# Patient Record
Sex: Male | Born: 1950 | Race: Black or African American | Hispanic: No | Marital: Married | Smoking: Former smoker
Health system: Southern US, Community
[De-identification: ages and names within clinical notes are randomized; demographics above are authoritative.]

## PROBLEM LIST (undated history)

## (undated) DIAGNOSIS — D509 Iron deficiency anemia, unspecified: Secondary | ICD-10-CM

## (undated) DIAGNOSIS — B182 Chronic viral hepatitis C: Secondary | ICD-10-CM

## (undated) DIAGNOSIS — E1142 Type 2 diabetes mellitus with diabetic polyneuropathy: Secondary | ICD-10-CM

## (undated) DIAGNOSIS — I651 Occlusion and stenosis of basilar artery: Secondary | ICD-10-CM

## (undated) DIAGNOSIS — K513 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: Secondary | ICD-10-CM

## (undated) DIAGNOSIS — R42 Dizziness and giddiness: Secondary | ICD-10-CM

## (undated) DIAGNOSIS — K7400 Hepatic fibrosis, unspecified: Secondary | ICD-10-CM

## (undated) DIAGNOSIS — Z8619 Personal history of other infectious and parasitic diseases: Secondary | ICD-10-CM

## (undated) DIAGNOSIS — E11319 Type 2 diabetes mellitus with unspecified diabetic retinopathy without macular edema: Secondary | ICD-10-CM

## (undated) DIAGNOSIS — M1990 Unspecified osteoarthritis, unspecified site: Secondary | ICD-10-CM

## (undated) DIAGNOSIS — E119 Type 2 diabetes mellitus without complications: Secondary | ICD-10-CM

## (undated) DIAGNOSIS — R202 Paresthesia of skin: Secondary | ICD-10-CM

## (undated) DIAGNOSIS — F32A Depression, unspecified: Secondary | ICD-10-CM

## (undated) DIAGNOSIS — R269 Unspecified abnormalities of gait and mobility: Secondary | ICD-10-CM

## (undated) DIAGNOSIS — I6502 Occlusion and stenosis of left vertebral artery: Secondary | ICD-10-CM

## (undated) HISTORY — PX: OTHER SURGICAL HISTORY: SHX169

---

## 2000-06-02 ENCOUNTER — Encounter: Admission: RE | Admit: 2000-06-02 | Discharge: 2000-06-02 | Payer: Self-pay | Admitting: Emergency Medicine

## 2000-06-02 ENCOUNTER — Encounter: Payer: Self-pay | Admitting: Emergency Medicine

## 2000-06-22 ENCOUNTER — Encounter: Payer: Self-pay | Admitting: General Surgery

## 2000-06-23 ENCOUNTER — Observation Stay (HOSPITAL_COMMUNITY): Admission: RE | Admit: 2000-06-23 | Discharge: 2000-06-24 | Payer: Self-pay | Admitting: General Surgery

## 2000-06-23 ENCOUNTER — Encounter (INDEPENDENT_AMBULATORY_CARE_PROVIDER_SITE_OTHER): Payer: Self-pay

## 2005-03-04 ENCOUNTER — Encounter: Admission: RE | Admit: 2005-03-04 | Discharge: 2005-03-04 | Payer: Self-pay | Admitting: Emergency Medicine

## 2007-05-01 ENCOUNTER — Ambulatory Visit (HOSPITAL_BASED_OUTPATIENT_CLINIC_OR_DEPARTMENT_OTHER): Admission: RE | Admit: 2007-05-01 | Discharge: 2007-05-01 | Payer: Self-pay | Admitting: Orthopedic Surgery

## 2007-12-28 ENCOUNTER — Encounter: Admission: RE | Admit: 2007-12-28 | Discharge: 2007-12-28 | Payer: Self-pay | Admitting: Emergency Medicine

## 2008-09-26 ENCOUNTER — Encounter: Admission: RE | Admit: 2008-09-26 | Discharge: 2008-09-26 | Payer: Self-pay | Admitting: Family Medicine

## 2010-01-20 ENCOUNTER — Encounter: Admission: RE | Admit: 2010-01-20 | Discharge: 2010-01-20 | Payer: Self-pay | Admitting: Family Medicine

## 2010-06-07 ENCOUNTER — Encounter: Payer: Self-pay | Admitting: Emergency Medicine

## 2010-09-24 ENCOUNTER — Ambulatory Visit
Admission: RE | Admit: 2010-09-24 | Discharge: 2010-09-24 | Disposition: A | Discharge status: Home / Self Care | Payer: 59 | Source: Ambulatory Visit | Attending: Family Medicine | Admitting: Family Medicine

## 2010-09-24 ENCOUNTER — Other Ambulatory Visit: Payer: Self-pay | Admitting: Family Medicine

## 2010-09-24 DIAGNOSIS — R0602 Shortness of breath: Secondary | ICD-10-CM

## 2010-09-28 NOTE — Op Note (Signed)
NAMEBRADIN, Kirk Rocha NO.:  [id number]   MEDICAL RECORD NO.:  [id number]          PATIENT TYPE:  AMB   LOCATION:  DSC                          FACILITY:  [facility]   PHYSICIAN:  Cindee Salt, M.D.       DATE OF BIRTH:  04-01-1951   DATE OF PROCEDURE:  05/01/2007  DATE OF DISCHARGE:                               OPERATIVE REPORT   PREOPERATIVE DIAGNOSIS:  Stenosing tenosynovitis right ring finger.   POSTOPERATIVE DIAGNOSIS:  Stenosing tenosynovitis right ring finger.   OPERATION:  Release A1 pulley right ring finger.   SURGEON:  Cindee Salt, M.D.   ANESTHESIA:  Forearm based IV regional.   HISTORY:  The patient is a 61 year old male with a history of triggering  of the right ring finger.  This has not responded to conservative  treatment.  He is desirous of surgical release.  Preoperative and  postoperative course were discussed along with the risks and  complications.  He is aware there is no guarantee with the surgery, the  possibility of infection, recurrence, injury to arteries, nerves,  tendons, incomplete relief of symptoms, dystrophy.  In the preoperative  area, the patient is seen, questions encouraged and answered, the  extremity marked by both the patient and surgeon.   PROCEDURE:  The patient is brought to the operating room where a forearm  based IV regional anesthetic was carried out without difficulty.  He was  prepped using DuraPrep, supine position, right arm free.  An oblique  incision was made over the A1 pulley right ring finger and carried down  through subcutaneous tissue.  Bleeders were electrocauterized.  The  neurovascular structures were retracted. The A1 pulley was identified.  This was found to be thickened.  An incision was then made on its radial  aspect releasing the entire A1 pulley.  A small incision was made  centrally in the A2 pulley.  The finger was placed through a full range  motion, no further triggering was identified.  The  wound was irrigated.  The skin was then closed with interrupted follow 5-0 Vicryl Rapide  sutures.  A sterile compressive dressing was applied.  The patient  tolerated the procedure well and was taken to the recovery room for  observation in satisfactory condition.  He will be discharged home to  return to the [facility] [place] in one week on [place] [facility].           ______________________________  Cindee Salt, M.D.     GK/MEDQ  D:  05/01/2007  T:  05/01/2007  Job:  [id number]

## 2010-10-01 NOTE — Op Note (Signed)
[facility]  Patient:    Kirk Rocha, Kirk Rocha                      MRN: [id number] Proc. Date: 06/23/00 Adm. Date:  [id number] Disc. Date: [id number] Attending:  Chevis Pretty S                           Operative Report  PREOPERATIVE DIAGNOSIS:  Symptomatic cholelithiasis.  POSTOPERATIVE DIAGNOSIS: Symptomatic cholelithiasis.  OPERATION:  Laparoscopic cholecystectomy.  SURGEON:  Chevis Pretty, M.D.  ASSISTANT:  Donnie Coffin. Samuella Cota, M.D.  ANESTHESIA:  General endotracheal anesthesia.  DESCRIPTION OF PROCEDURE:  After informed consent was obtained, the patient was brought to the operating room and placed in the supine position on the operating table.  After adequate induction of general anesthesia, the patients abdomen was prepped with Betadine and draped in the usual sterile manner.  A small transverse supraumbilical incision was made with a #15 blade knife.  This incision was carried down through the subcutaneous tissue bluntly using a Kelly clamp and [employer] retractors until the linea alba was identified.  The linea alba was also incised with the #15 blade knife, and each side was grasped with Kocher clamps and elevated.  The preperitoneal space was probed bluntly with the hemostat until the peritoneum was opened and the access was gained to the abdominal cavity.  A finger was inserted through this hole, and the anterior abdominal wall was palpated and no adhesions apparent.  A 0 Vicryl pursestring stitch was placed in the fascia around this hole.  A Hasson cannula was placed through this hole into the abdominal cavity and anchored with previously placed Vicryl pursestring stitch.  The abdomen was then insufflated with carbon dioxide, and the laparoscope was placed through the Hasson cannula.  The liver edge and dome of the gallbladder were readily identifiable.  A small transverse upper midline incision was  made with a #15 blade knife after this area was  infiltrated with 0.25% Marcaine, and a 10 mm port was placed through this incision bluntly into the abdominal cavity under direct vision.  Two smaller incisions were made laterally on the right side of the abdomen below the costal margin after infiltrating this area with 0.25% Marcaine, and two 5 mm ports were placed through these incisions bluntly into the abdominal cavity again under direct vision.  A blunt grasper was placed through the lateral most 5 mm port and used to grasp the dome of the gallbladder and elevate it anteriorly and superiorly.  A blunt grasper was placed through the other 5 mm port and used to retract on the body and neck of the gallbladder.  A [place] dissector was placed through the upper midline port and, using the electrocautery, the peritoneal reflection over top of the gallbladder neck area was opened.  Once this was accomplished, the rest of the area around the gallbladder neck and cystic duct was dissected in a blunt manner with the [place] dissector until the gallbladder neck/cystic duct junction was readily identified and freed in a circumferential manner.  Care was taken at this point to make sure the common duct was medial to all this dissection.  Three clips were placed proximally and one distally on the cystic duct, the the cystic duct was divided between the two with the laparoscopic scissors.  Several small vessels were identified posterior to this, and each was dissected circumferentially with the [employer].  Two  clamps were placed proximally and one distally on each of these vessels, and each of these was divided between the two with the laparoscopic scissors.  The gallbladder was then separated from the liver bed using the hook electrocautery.  Prior to completely detaching the gallbladder from the liver bed, the liver bed was inspected, and several small bleeding points were coagulated with the Bovie electrocautery.  The rest of the  gallbladder was then removed form the liver bed using the hook electrocautery.  The laparoscope was moved to the upper midline incision, and a gallbladder grasper was placed through the Hasson cannula and grasped the neck of the gallbladder.  The gallbladder was then removed through the supraumbilical port with the Hasson cannula without difficulty.  The fascia at this incision was then closed with the previously placed Vicryl pursestring stitch.  The abdomen was then irrigated with copious amounts of saline until the affluent was clear.  Each of the rest of the ports were then removed under direct vision and found to be hemostatic.  The skin incisions were then closed with interrupted 4-0 Monocryl subcuticular stitches.  Benzoin and Steri-Strips were applied.  The patient tolerated the procedure well.  At the end of the case, all needle, sponge, and instrument counts were correct.  The patient was awakened and taken to the recovery room in stable condition. DD:  06/23/00 TD:  06/25/00 Job: [id number]

## 2011-02-18 LAB — POCT HEMOGLOBIN-HEMACUE: Hemoglobin: 15.6

## 2011-05-16 ENCOUNTER — Ambulatory Visit
Admission: RE | Admit: 2011-05-16 | Discharge: 2011-05-16 | Disposition: A | Discharge status: Home / Self Care | Payer: 59 | Source: Ambulatory Visit | Attending: Family Medicine | Admitting: Family Medicine

## 2011-05-16 ENCOUNTER — Other Ambulatory Visit: Payer: Self-pay | Admitting: Family Medicine

## 2011-05-16 DIAGNOSIS — K921 Melena: Secondary | ICD-10-CM

## 2012-02-07 ENCOUNTER — Other Ambulatory Visit: Payer: Self-pay | Admitting: Family Medicine

## 2012-02-07 ENCOUNTER — Ambulatory Visit
Admission: RE | Admit: 2012-02-07 | Discharge: 2012-02-07 | Disposition: A | Discharge status: Home / Self Care | Payer: 59 | Source: Ambulatory Visit | Attending: Family Medicine | Admitting: Family Medicine

## 2012-02-07 DIAGNOSIS — M25519 Pain in unspecified shoulder: Secondary | ICD-10-CM

## 2013-02-11 ENCOUNTER — Encounter (INDEPENDENT_AMBULATORY_CARE_PROVIDER_SITE_OTHER): Payer: 59 | Admitting: Ophthalmology

## 2013-02-11 DIAGNOSIS — E1139 Type 2 diabetes mellitus with other diabetic ophthalmic complication: Secondary | ICD-10-CM

## 2013-02-11 DIAGNOSIS — H43819 Vitreous degeneration, unspecified eye: Secondary | ICD-10-CM

## 2013-02-11 DIAGNOSIS — E11319 Type 2 diabetes mellitus with unspecified diabetic retinopathy without macular edema: Secondary | ICD-10-CM

## 2013-02-11 DIAGNOSIS — H2510 Age-related nuclear cataract, unspecified eye: Secondary | ICD-10-CM

## 2014-02-17 ENCOUNTER — Ambulatory Visit (INDEPENDENT_AMBULATORY_CARE_PROVIDER_SITE_OTHER): Payer: 59 | Admitting: Ophthalmology

## 2014-03-24 ENCOUNTER — Ambulatory Visit (INDEPENDENT_AMBULATORY_CARE_PROVIDER_SITE_OTHER): Payer: 59 | Admitting: Ophthalmology

## 2014-03-24 DIAGNOSIS — E11319 Type 2 diabetes mellitus with unspecified diabetic retinopathy without macular edema: Secondary | ICD-10-CM

## 2014-03-24 DIAGNOSIS — E11329 Type 2 diabetes mellitus with mild nonproliferative diabetic retinopathy without macular edema: Secondary | ICD-10-CM

## 2014-03-24 DIAGNOSIS — H43813 Vitreous degeneration, bilateral: Secondary | ICD-10-CM

## 2015-03-25 ENCOUNTER — Ambulatory Visit (INDEPENDENT_AMBULATORY_CARE_PROVIDER_SITE_OTHER): Payer: 59 | Admitting: Ophthalmology

## 2015-04-22 ENCOUNTER — Ambulatory Visit (INDEPENDENT_AMBULATORY_CARE_PROVIDER_SITE_OTHER): Payer: Commercial Managed Care - HMO | Admitting: Ophthalmology

## 2015-04-22 DIAGNOSIS — E11319 Type 2 diabetes mellitus with unspecified diabetic retinopathy without macular edema: Secondary | ICD-10-CM

## 2015-04-22 DIAGNOSIS — H2513 Age-related nuclear cataract, bilateral: Secondary | ICD-10-CM | POA: Diagnosis not present

## 2015-04-22 DIAGNOSIS — E113293 Type 2 diabetes mellitus with mild nonproliferative diabetic retinopathy without macular edema, bilateral: Secondary | ICD-10-CM | POA: Diagnosis not present

## 2015-04-22 DIAGNOSIS — H43813 Vitreous degeneration, bilateral: Secondary | ICD-10-CM

## 2015-07-23 DIAGNOSIS — Z79899 Other long term (current) drug therapy: Secondary | ICD-10-CM | POA: Diagnosis not present

## 2015-07-23 DIAGNOSIS — Z125 Encounter for screening for malignant neoplasm of prostate: Secondary | ICD-10-CM | POA: Diagnosis not present

## 2015-07-23 DIAGNOSIS — E785 Hyperlipidemia, unspecified: Secondary | ICD-10-CM | POA: Diagnosis not present

## 2015-07-23 DIAGNOSIS — Z23 Encounter for immunization: Secondary | ICD-10-CM | POA: Diagnosis not present

## 2015-07-23 DIAGNOSIS — E11311 Type 2 diabetes mellitus with unspecified diabetic retinopathy with macular edema: Secondary | ICD-10-CM | POA: Diagnosis not present

## 2015-07-23 DIAGNOSIS — Z0000 Encounter for general adult medical examination without abnormal findings: Secondary | ICD-10-CM | POA: Diagnosis not present

## 2015-07-28 ENCOUNTER — Other Ambulatory Visit: Payer: Self-pay | Admitting: Family Medicine

## 2015-07-28 DIAGNOSIS — Z136 Encounter for screening for cardiovascular disorders: Secondary | ICD-10-CM

## 2015-08-05 ENCOUNTER — Ambulatory Visit
Admission: RE | Admit: 2015-08-05 | Discharge: 2015-08-05 | Disposition: A | Discharge status: Home / Self Care | Payer: Medicare Other | Source: Ambulatory Visit | Attending: Family Medicine | Admitting: Family Medicine

## 2015-08-05 DIAGNOSIS — Z136 Encounter for screening for cardiovascular disorders: Secondary | ICD-10-CM

## 2016-01-29 DIAGNOSIS — Z23 Encounter for immunization: Secondary | ICD-10-CM | POA: Diagnosis not present

## 2016-01-29 DIAGNOSIS — E785 Hyperlipidemia, unspecified: Secondary | ICD-10-CM | POA: Diagnosis not present

## 2016-01-29 DIAGNOSIS — Z7984 Long term (current) use of oral hypoglycemic drugs: Secondary | ICD-10-CM | POA: Diagnosis not present

## 2016-01-29 DIAGNOSIS — E781 Pure hyperglyceridemia: Secondary | ICD-10-CM | POA: Diagnosis not present

## 2016-01-29 DIAGNOSIS — E1165 Type 2 diabetes mellitus with hyperglycemia: Secondary | ICD-10-CM | POA: Diagnosis not present

## 2016-04-21 ENCOUNTER — Ambulatory Visit (INDEPENDENT_AMBULATORY_CARE_PROVIDER_SITE_OTHER): Payer: Medicare Other | Admitting: Ophthalmology

## 2016-04-21 DIAGNOSIS — E11319 Type 2 diabetes mellitus with unspecified diabetic retinopathy without macular edema: Secondary | ICD-10-CM

## 2016-04-21 DIAGNOSIS — E113293 Type 2 diabetes mellitus with mild nonproliferative diabetic retinopathy without macular edema, bilateral: Secondary | ICD-10-CM

## 2016-04-21 DIAGNOSIS — H43813 Vitreous degeneration, bilateral: Secondary | ICD-10-CM | POA: Diagnosis not present

## 2016-06-16 DIAGNOSIS — Z7984 Long term (current) use of oral hypoglycemic drugs: Secondary | ICD-10-CM | POA: Diagnosis not present

## 2016-06-16 DIAGNOSIS — E781 Pure hyperglyceridemia: Secondary | ICD-10-CM | POA: Diagnosis not present

## 2016-06-16 DIAGNOSIS — E1165 Type 2 diabetes mellitus with hyperglycemia: Secondary | ICD-10-CM | POA: Diagnosis not present

## 2016-07-29 DIAGNOSIS — Z7984 Long term (current) use of oral hypoglycemic drugs: Secondary | ICD-10-CM | POA: Diagnosis not present

## 2016-07-29 DIAGNOSIS — E11311 Type 2 diabetes mellitus with unspecified diabetic retinopathy with macular edema: Secondary | ICD-10-CM | POA: Diagnosis not present

## 2016-07-29 DIAGNOSIS — I708 Atherosclerosis of other arteries: Secondary | ICD-10-CM | POA: Diagnosis not present

## 2016-07-29 DIAGNOSIS — Z125 Encounter for screening for malignant neoplasm of prostate: Secondary | ICD-10-CM | POA: Diagnosis not present

## 2016-07-29 DIAGNOSIS — H3581 Retinal edema: Secondary | ICD-10-CM | POA: Diagnosis not present

## 2016-07-29 DIAGNOSIS — K649 Unspecified hemorrhoids: Secondary | ICD-10-CM | POA: Diagnosis not present

## 2016-07-29 DIAGNOSIS — Z0000 Encounter for general adult medical examination without abnormal findings: Secondary | ICD-10-CM | POA: Diagnosis not present

## 2016-07-29 DIAGNOSIS — E1140 Type 2 diabetes mellitus with diabetic neuropathy, unspecified: Secondary | ICD-10-CM | POA: Diagnosis not present

## 2016-07-29 DIAGNOSIS — Z79899 Other long term (current) drug therapy: Secondary | ICD-10-CM | POA: Diagnosis not present

## 2016-07-29 DIAGNOSIS — E113593 Type 2 diabetes mellitus with proliferative diabetic retinopathy without macular edema, bilateral: Secondary | ICD-10-CM | POA: Diagnosis not present

## 2016-07-29 DIAGNOSIS — E785 Hyperlipidemia, unspecified: Secondary | ICD-10-CM | POA: Diagnosis not present

## 2016-08-08 DIAGNOSIS — K625 Hemorrhage of anus and rectum: Secondary | ICD-10-CM | POA: Diagnosis not present

## 2016-08-22 DIAGNOSIS — K625 Hemorrhage of anus and rectum: Secondary | ICD-10-CM | POA: Diagnosis not present

## 2016-08-22 DIAGNOSIS — R141 Gas pain: Secondary | ICD-10-CM | POA: Diagnosis not present

## 2016-08-22 DIAGNOSIS — K648 Other hemorrhoids: Secondary | ICD-10-CM | POA: Diagnosis not present

## 2016-09-21 DIAGNOSIS — K5289 Other specified noninfective gastroenteritis and colitis: Secondary | ICD-10-CM | POA: Diagnosis not present

## 2016-09-21 DIAGNOSIS — K640 First degree hemorrhoids: Secondary | ICD-10-CM | POA: Diagnosis not present

## 2016-09-21 DIAGNOSIS — K625 Hemorrhage of anus and rectum: Secondary | ICD-10-CM | POA: Diagnosis not present

## 2016-09-27 DIAGNOSIS — K5289 Other specified noninfective gastroenteritis and colitis: Secondary | ICD-10-CM | POA: Diagnosis not present

## 2016-10-04 DIAGNOSIS — K513 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: Secondary | ICD-10-CM | POA: Diagnosis not present

## 2016-10-26 DIAGNOSIS — K513 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: Secondary | ICD-10-CM | POA: Diagnosis not present

## 2016-10-31 DIAGNOSIS — E1165 Type 2 diabetes mellitus with hyperglycemia: Secondary | ICD-10-CM | POA: Diagnosis not present

## 2017-01-02 ENCOUNTER — Encounter: Payer: Self-pay | Admitting: Family Medicine

## 2017-02-14 DIAGNOSIS — E785 Hyperlipidemia, unspecified: Secondary | ICD-10-CM | POA: Diagnosis not present

## 2017-02-14 DIAGNOSIS — Z23 Encounter for immunization: Secondary | ICD-10-CM | POA: Diagnosis not present

## 2017-02-14 DIAGNOSIS — E08311 Diabetes mellitus due to underlying condition with unspecified diabetic retinopathy with macular edema: Secondary | ICD-10-CM | POA: Diagnosis not present

## 2017-03-24 DIAGNOSIS — E1165 Type 2 diabetes mellitus with hyperglycemia: Secondary | ICD-10-CM | POA: Diagnosis not present

## 2017-03-24 DIAGNOSIS — Z7984 Long term (current) use of oral hypoglycemic drugs: Secondary | ICD-10-CM | POA: Diagnosis not present

## 2017-03-31 DIAGNOSIS — K51319 Ulcerative (chronic) rectosigmoiditis with unspecified complications: Secondary | ICD-10-CM | POA: Diagnosis not present

## 2017-04-21 ENCOUNTER — Ambulatory Visit (INDEPENDENT_AMBULATORY_CARE_PROVIDER_SITE_OTHER): Payer: Medicare Other | Admitting: Ophthalmology

## 2017-04-21 DIAGNOSIS — E11319 Type 2 diabetes mellitus with unspecified diabetic retinopathy without macular edema: Secondary | ICD-10-CM

## 2017-04-21 DIAGNOSIS — H43813 Vitreous degeneration, bilateral: Secondary | ICD-10-CM

## 2017-04-21 DIAGNOSIS — E113292 Type 2 diabetes mellitus with mild nonproliferative diabetic retinopathy without macular edema, left eye: Secondary | ICD-10-CM

## 2017-04-21 DIAGNOSIS — H2511 Age-related nuclear cataract, right eye: Secondary | ICD-10-CM | POA: Diagnosis not present

## 2017-05-03 DIAGNOSIS — Z7984 Long term (current) use of oral hypoglycemic drugs: Secondary | ICD-10-CM | POA: Diagnosis not present

## 2017-05-03 DIAGNOSIS — E1165 Type 2 diabetes mellitus with hyperglycemia: Secondary | ICD-10-CM | POA: Diagnosis not present

## 2017-08-02 DIAGNOSIS — E7800 Pure hypercholesterolemia, unspecified: Secondary | ICD-10-CM | POA: Diagnosis not present

## 2017-08-02 DIAGNOSIS — E11319 Type 2 diabetes mellitus with unspecified diabetic retinopathy without macular edema: Secondary | ICD-10-CM | POA: Diagnosis not present

## 2017-08-02 DIAGNOSIS — E1140 Type 2 diabetes mellitus with diabetic neuropathy, unspecified: Secondary | ICD-10-CM | POA: Diagnosis not present

## 2017-08-02 DIAGNOSIS — E1165 Type 2 diabetes mellitus with hyperglycemia: Secondary | ICD-10-CM | POA: Diagnosis not present

## 2017-08-22 DIAGNOSIS — Z23 Encounter for immunization: Secondary | ICD-10-CM | POA: Diagnosis not present

## 2017-08-22 DIAGNOSIS — E1165 Type 2 diabetes mellitus with hyperglycemia: Secondary | ICD-10-CM | POA: Diagnosis not present

## 2017-08-22 DIAGNOSIS — I651 Occlusion and stenosis of basilar artery: Secondary | ICD-10-CM | POA: Diagnosis not present

## 2017-08-22 DIAGNOSIS — F322 Major depressive disorder, single episode, severe without psychotic features: Secondary | ICD-10-CM | POA: Diagnosis not present

## 2017-08-22 DIAGNOSIS — Z125 Encounter for screening for malignant neoplasm of prostate: Secondary | ICD-10-CM | POA: Diagnosis not present

## 2017-08-22 DIAGNOSIS — Z794 Long term (current) use of insulin: Secondary | ICD-10-CM | POA: Diagnosis not present

## 2017-08-22 DIAGNOSIS — E1140 Type 2 diabetes mellitus with diabetic neuropathy, unspecified: Secondary | ICD-10-CM | POA: Diagnosis not present

## 2017-08-22 DIAGNOSIS — Z0000 Encounter for general adult medical examination without abnormal findings: Secondary | ICD-10-CM | POA: Diagnosis not present

## 2017-08-22 DIAGNOSIS — E11319 Type 2 diabetes mellitus with unspecified diabetic retinopathy without macular edema: Secondary | ICD-10-CM | POA: Diagnosis not present

## 2017-08-22 DIAGNOSIS — I6602 Occlusion and stenosis of left middle cerebral artery: Secondary | ICD-10-CM | POA: Diagnosis not present

## 2017-08-22 DIAGNOSIS — Z1159 Encounter for screening for other viral diseases: Secondary | ICD-10-CM | POA: Diagnosis not present

## 2017-08-30 DIAGNOSIS — R768 Other specified abnormal immunological findings in serum: Secondary | ICD-10-CM | POA: Diagnosis not present

## 2017-09-15 DIAGNOSIS — B182 Chronic viral hepatitis C: Secondary | ICD-10-CM | POA: Diagnosis not present

## 2017-09-18 ENCOUNTER — Other Ambulatory Visit: Payer: Self-pay | Admitting: Nurse Practitioner

## 2017-09-18 DIAGNOSIS — B1920 Unspecified viral hepatitis C without hepatic coma: Secondary | ICD-10-CM

## 2017-09-21 ENCOUNTER — Encounter: Payer: Medicare Other | Attending: Family Medicine

## 2017-09-21 DIAGNOSIS — E1165 Type 2 diabetes mellitus with hyperglycemia: Secondary | ICD-10-CM

## 2017-09-21 DIAGNOSIS — Z713 Dietary counseling and surveillance: Secondary | ICD-10-CM | POA: Diagnosis not present

## 2017-09-21 NOTE — Progress Notes (Signed)
Diabetes Self-Management Education  Visit Type: First/Initial  Appt. Start Time: 8:00 Appt. End Time: 9:15  09/21/2017  Mr. Kirk Rocha, identified by name and date of birth, is a 67 y.o. male with a diagnosis of Diabetes: Type 2.   ASSESSMENT  Pt referred for diabetes education. Pt reports recent weight gain of 18 lbs and a heightened appetite r/t prescribed steroid medication. Pt forgot list of medications at visit today and could not remember names or doses (6-7 medications daily). 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 He is willing to check his feet daily and is planning on seeing a dentist.   Height  (1.676 m), weight 168 lb (76.2 kg). Body mass index is 27.12 kg/m.  Diabetes Self-Management Education - 09/21/17 0914      Visit Information   Visit Type  First/Initial      Initial Visit   Diabetes Type  Type 2    Are you currently following a meal plan?  No    Are you taking your medications as prescribed?  Yes    Date Diagnosed  2010      Health Coping   How would you rate your overall health?  Good      Psychosocial Assessment   Patient Belief/Attitude about Diabetes  Afraid    Self-care barriers  None    Self-management support  Family    Patient Concerns  Nutrition/Meal planning;Weight Control    Special Needs  None    Preferred Learning Style  No preference indicated    Learning Readiness  Contemplating    How often do you need to have someone help you when you read instructions, pamphlets, or other written materials from your doctor or pharmacy?  1 - Never    What is the last grade level you completed in school?  12th grade      Pre-Education Assessment   Patient understands the diabetes disease and treatment process.  Needs Instruction    Patient understands incorporating nutritional management into lifestyle.  Needs Instruction    Patient undertands incorporating physical activity into lifestyle.  Needs Instruction    Patient understands using  medications safely.  Needs Review    Patient understands monitoring blood glucose, interpreting and using results  Demonstrates understanding / competency    Patient understands prevention, detection, and treatment of acute complications.  Needs Instruction    Patient understands prevention, detection, and treatment of chronic complications.  Needs Instruction    Patient understands how to develop strategies to address psychosocial issues.  Needs Instruction    Patient understands how to develop strategies to promote health/change behavior.  Needs Instruction      Complications   Last HgB A1C per patient/outside source  9.3 %    How often do you check your blood sugar?  3-4 times/day    Fasting Blood glucose range (mg/dL)  478-295    Postprandial Blood glucose range (mg/dL)  621-308    Number of hypoglycemic episodes per month  1    Can you tell when your blood sugar is low?  Yes    What do you do if your blood sugar is low?  "eat something"    Number of hyperglycemic episodes per week  7    Can you tell when your blood sugar is high?  No    Have you had a dilated eye exam in the past 12 months?  Yes    Have you had a dental exam in the past 12 months?  No    Are you checking your feet?  No      Dietary Intake   Breakfast  2 scrambled eggs and 2 bacon strip sandwich on 2 pieces of honey weat toast with mayo    Lunch  green beans, pork ribs, sweet potato casserole with sugar and butter, clam chowder soup, fried okra, crackers, 14-15 popcorn shrimp     Snack (afternoon)  4 cookies from [employer]  fried chicken (2 wings, 1 leg), cauliflower and corn    Snack (evening)  ~handful of honey roasted peanuts    Beverage(s)  unsweet tea, Peach Nature's Twist, 2-4 bottles of water      Exercise   Exercise Type  ADL's    How many days per week to you exercise?  0      Patient Education   Previous Diabetes Education  Yes (please comment) RD spoke with him ~4-5 years ago    Disease  state   Definition of diabetes, type 1 and 2, and the diagnosis of diabetes;Factors that contribute to the development of diabetes    Nutrition management   Role of diet in the treatment of diabetes and the relationship between the three main macronutrients and blood glucose level;Carbohydrate counting;Information on hints to eating out and maintain blood glucose control.;Food label reading, portion sizes and measuring food.    Physical activity and exercise   Role of exercise on diabetes management, blood pressure control and cardiac health.;Identified with patient nutritional and/or medication changes necessary with exercise.    Monitoring  Daily foot exams;Yearly dilated eye exam    Acute complications  Taught treatment of hypoglycemia - the 15 rule.    Chronic complications  Relationship between chronic complications and blood glucose control;Assessed and discussed foot care and prevention of foot problems;Identified and discussed with patient  current chronic complications;Retinopathy and reason for yearly dilated eye exams;Dental care    Psychosocial adjustment  Identified and addressed patients feelings and concerns about diabetes;Role of stress on diabetes;Brainstormed with patient on coping mechanisms for social situations, getting support from significant others, dealing with feelings about diabetes    Personal strategies to promote health  Lifestyle issues that need to be addressed for better diabetes care      Individualized Goals (developed by patient)   Nutrition  General guidelines for healthy choices and portions discussed    Physical Activity  Exercise 3-5 times per week    Medications  take my medication as prescribed    Monitoring   test my blood glucose as discussed    Reducing Risk  do foot checks daily;examine blood glucose patterns;treat hypoglycemia with 15 grams of carbs if blood glucose less than /dL      Post-Education Assessment   Patient understands the diabetes  disease and treatment process.  Demonstrates understanding / competency    Patient understands incorporating nutritional management into lifestyle.  Demonstrates understanding / competency    Patient undertands incorporating physical activity into lifestyle.  Demonstrates understanding / competency    Patient understands using medications safely.  Needs Review    Patient understands monitoring blood glucose, interpreting and using results  Demonstrates understanding / competency    Patient understands prevention, detection, and treatment of acute complications.  Demonstrates understanding / competency    Patient understands prevention, detection, and treatment of chronic complications.  Demonstrates understanding / competency    Patient understands how to develop strategies to address psychosocial issues.  Needs Review  Patient understands how to develop strategies to promote health/change behavior.  Demonstrates understanding / competency      Outcomes   Expected Outcomes  Demonstrated limited interest in learning.  Expect minimal changes    Future DMSE  PRN    Program Status  Completed       Individualized Plan for Diabetes Self-Management Training:   Learning Objective:  Patient will have a greater understanding of diabetes self-management. Patient education plan is to attend individual and/or group sessions per assessed needs and concerns.   Plan:   Patient Instructions   3-4 carbohydrate servings at each meal   Manage stress levels, work on sleep pattern (increase hours), increase activity level (walk around mall 5 times/week)  Daily foot checks   To aid with bowel movements; water, activity and fiber  15-15 rule for hypoglycemia: 15 grams fast acting carbohydrate, wait 15 minutes, recheck glucose levels    Expected Outcomes:  Demonstrated limited interest in learning.  Expect minimal changes  Education material provided: Living Well with Diabetes, A1C conversion sheet,  Meal plan card and Snack sheet  If problems or questions, patient to contact team via:  Phone  Future DSME appointment: PRN

## 2017-09-21 NOTE — Patient Instructions (Addendum)
   3-4 carbohydrate servings at each meal   Manage stress levels, work on sleep pattern (increase hours), increase activity level (walk around mall 5 times/week)  Daily foot checks   To aid with bowel movements; water, activity and fiber  15-15 rule for hypoglycemia: 15 grams fast acting carbohydrate, wait 15 minutes, recheck glucose levels

## 2017-09-29 DIAGNOSIS — Z23 Encounter for immunization: Secondary | ICD-10-CM | POA: Diagnosis not present

## 2017-10-18 ENCOUNTER — Ambulatory Visit
Admission: RE | Admit: 2017-10-18 | Discharge: 2017-10-18 | Disposition: A | Discharge status: Home / Self Care | Payer: Medicare Other | Source: Ambulatory Visit | Attending: Nurse Practitioner | Admitting: Nurse Practitioner

## 2017-10-18 DIAGNOSIS — B1920 Unspecified viral hepatitis C without hepatic coma: Secondary | ICD-10-CM

## 2017-10-18 DIAGNOSIS — B182 Chronic viral hepatitis C: Secondary | ICD-10-CM | POA: Diagnosis not present

## 2017-10-30 DIAGNOSIS — Z23 Encounter for immunization: Secondary | ICD-10-CM | POA: Diagnosis not present

## 2017-11-06 DIAGNOSIS — E1165 Type 2 diabetes mellitus with hyperglycemia: Secondary | ICD-10-CM | POA: Diagnosis not present

## 2017-11-06 DIAGNOSIS — E7800 Pure hypercholesterolemia, unspecified: Secondary | ICD-10-CM | POA: Diagnosis not present

## 2017-11-06 DIAGNOSIS — E1140 Type 2 diabetes mellitus with diabetic neuropathy, unspecified: Secondary | ICD-10-CM | POA: Diagnosis not present

## 2017-11-06 DIAGNOSIS — E11319 Type 2 diabetes mellitus with unspecified diabetic retinopathy without macular edema: Secondary | ICD-10-CM | POA: Diagnosis not present

## 2017-11-08 DIAGNOSIS — B182 Chronic viral hepatitis C: Secondary | ICD-10-CM | POA: Diagnosis not present

## 2017-11-08 DIAGNOSIS — K740 Hepatic fibrosis: Secondary | ICD-10-CM | POA: Diagnosis not present

## 2017-11-21 DIAGNOSIS — E785 Hyperlipidemia, unspecified: Secondary | ICD-10-CM | POA: Diagnosis not present

## 2017-12-11 DIAGNOSIS — B182 Chronic viral hepatitis C: Secondary | ICD-10-CM | POA: Diagnosis not present

## 2017-12-22 DIAGNOSIS — M7542 Impingement syndrome of left shoulder: Secondary | ICD-10-CM | POA: Diagnosis not present

## 2017-12-22 DIAGNOSIS — M25512 Pain in left shoulder: Secondary | ICD-10-CM | POA: Insufficient documentation

## 2018-02-06 DIAGNOSIS — B182 Chronic viral hepatitis C: Secondary | ICD-10-CM | POA: Diagnosis not present

## 2018-02-21 DIAGNOSIS — K740 Hepatic fibrosis: Secondary | ICD-10-CM | POA: Diagnosis not present

## 2018-02-21 DIAGNOSIS — B182 Chronic viral hepatitis C: Secondary | ICD-10-CM | POA: Diagnosis not present

## 2018-03-05 DIAGNOSIS — E7800 Pure hypercholesterolemia, unspecified: Secondary | ICD-10-CM | POA: Diagnosis not present

## 2018-03-05 DIAGNOSIS — E1140 Type 2 diabetes mellitus with diabetic neuropathy, unspecified: Secondary | ICD-10-CM | POA: Diagnosis not present

## 2018-03-05 DIAGNOSIS — E1165 Type 2 diabetes mellitus with hyperglycemia: Secondary | ICD-10-CM | POA: Diagnosis not present

## 2018-03-22 DIAGNOSIS — K513 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: Secondary | ICD-10-CM | POA: Diagnosis not present

## 2018-04-23 ENCOUNTER — Encounter (INDEPENDENT_AMBULATORY_CARE_PROVIDER_SITE_OTHER): Payer: Medicare Other | Admitting: Ophthalmology

## 2018-04-23 DIAGNOSIS — H43813 Vitreous degeneration, bilateral: Secondary | ICD-10-CM | POA: Diagnosis not present

## 2018-04-23 DIAGNOSIS — E11319 Type 2 diabetes mellitus with unspecified diabetic retinopathy without macular edema: Secondary | ICD-10-CM | POA: Diagnosis not present

## 2018-04-23 DIAGNOSIS — E113293 Type 2 diabetes mellitus with mild nonproliferative diabetic retinopathy without macular edema, bilateral: Secondary | ICD-10-CM

## 2018-04-23 DIAGNOSIS — H2513 Age-related nuclear cataract, bilateral: Secondary | ICD-10-CM

## 2018-04-30 DIAGNOSIS — B182 Chronic viral hepatitis C: Secondary | ICD-10-CM | POA: Diagnosis not present

## 2018-05-01 DIAGNOSIS — Z23 Encounter for immunization: Secondary | ICD-10-CM | POA: Diagnosis not present

## 2018-05-07 ENCOUNTER — Other Ambulatory Visit: Payer: Self-pay | Admitting: Nurse Practitioner

## 2018-05-07 DIAGNOSIS — K7400 Hepatic fibrosis, unspecified: Secondary | ICD-10-CM

## 2018-05-07 DIAGNOSIS — B182 Chronic viral hepatitis C: Secondary | ICD-10-CM | POA: Diagnosis not present

## 2018-05-15 ENCOUNTER — Ambulatory Visit
Admission: RE | Admit: 2018-05-15 | Discharge: 2018-05-15 | Disposition: A | Discharge status: Home / Self Care | Payer: Medicare Other | Source: Ambulatory Visit | Attending: Nurse Practitioner | Admitting: Nurse Practitioner

## 2018-05-15 DIAGNOSIS — K7689 Other specified diseases of liver: Secondary | ICD-10-CM | POA: Diagnosis not present

## 2018-05-15 DIAGNOSIS — K7400 Hepatic fibrosis, unspecified: Secondary | ICD-10-CM

## 2018-12-18 ENCOUNTER — Other Ambulatory Visit: Payer: Self-pay | Admitting: Nurse Practitioner

## 2018-12-18 DIAGNOSIS — K7400 Hepatic fibrosis, unspecified: Secondary | ICD-10-CM

## 2018-12-25 ENCOUNTER — Ambulatory Visit
Admission: RE | Admit: 2018-12-25 | Discharge: 2018-12-25 | Disposition: A | Discharge status: Home / Self Care | Payer: Medicare Other | Source: Ambulatory Visit | Attending: Nurse Practitioner | Admitting: Nurse Practitioner

## 2018-12-25 DIAGNOSIS — K7400 Hepatic fibrosis, unspecified: Secondary | ICD-10-CM

## 2019-04-24 ENCOUNTER — Encounter (INDEPENDENT_AMBULATORY_CARE_PROVIDER_SITE_OTHER): Payer: Self-pay

## 2019-04-24 ENCOUNTER — Other Ambulatory Visit: Payer: Self-pay

## 2019-04-24 ENCOUNTER — Encounter (INDEPENDENT_AMBULATORY_CARE_PROVIDER_SITE_OTHER): Payer: Medicare Other | Admitting: Ophthalmology

## 2019-06-07 ENCOUNTER — Encounter (INDEPENDENT_AMBULATORY_CARE_PROVIDER_SITE_OTHER): Payer: Medicare Other | Admitting: Ophthalmology

## 2019-06-07 DIAGNOSIS — E113293 Type 2 diabetes mellitus with mild nonproliferative diabetic retinopathy without macular edema, bilateral: Secondary | ICD-10-CM

## 2019-06-07 DIAGNOSIS — E11319 Type 2 diabetes mellitus with unspecified diabetic retinopathy without macular edema: Secondary | ICD-10-CM

## 2019-12-06 IMAGING — US US ABDOMEN LIMITED
1 series · 14 of 25 positions shown · non-contrast
Comparison: Abdominal ultrasound October 18, 2017

CLINICAL DATA: History of hepatic fibrosis and previous
cholecystectomy.

EXAM:
ULTRASOUND ABDOMEN LIMITED RIGHT UPPER QUADRANT

[Series 1: us abdomen limited · 0.23mm/px · 14 of 29 slices shown]
[im 1/29]
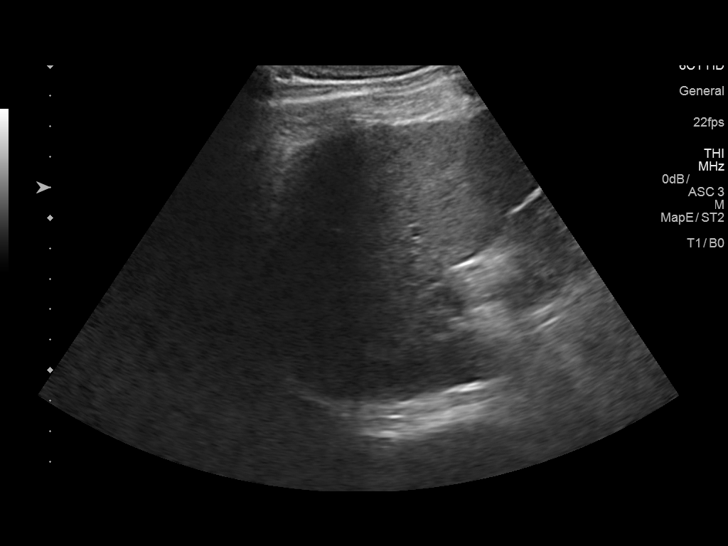
[im 3/29]
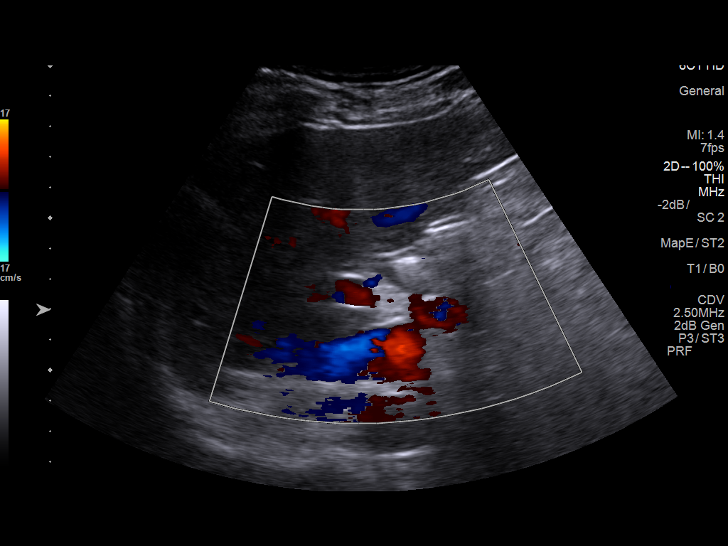
[im 5/29]
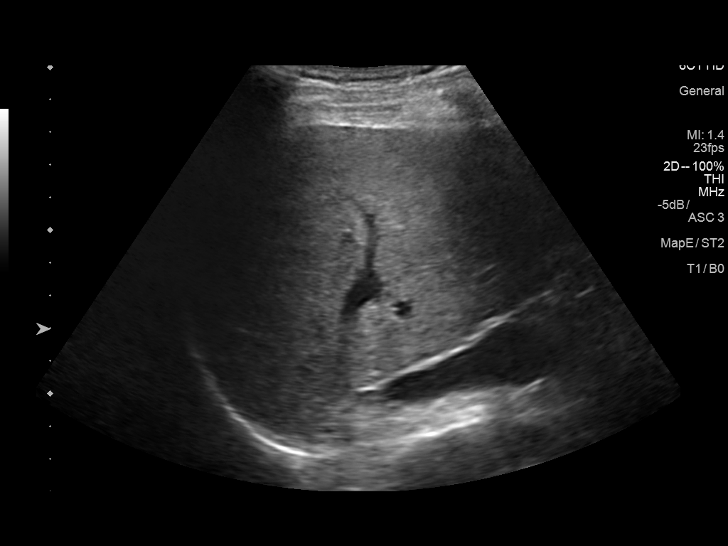
[im 8/29]
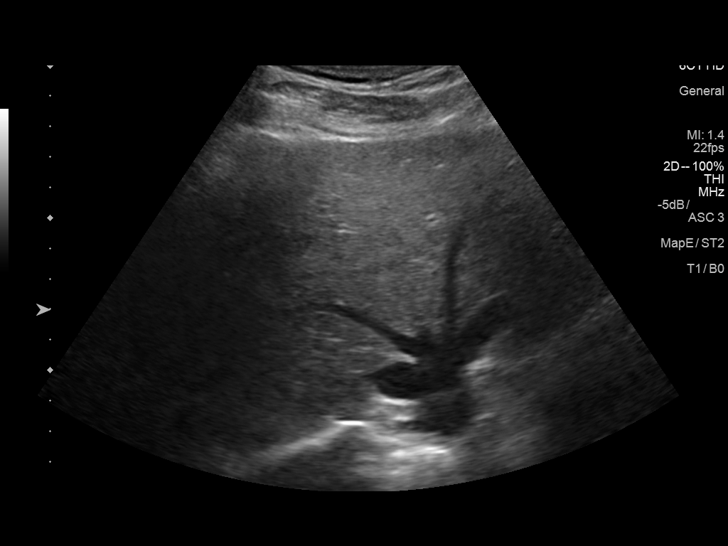
[im 10/29]
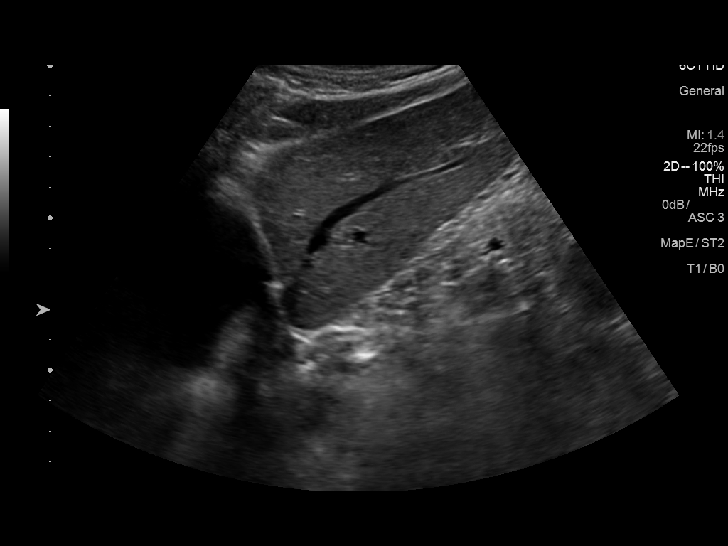
[im 11/29]
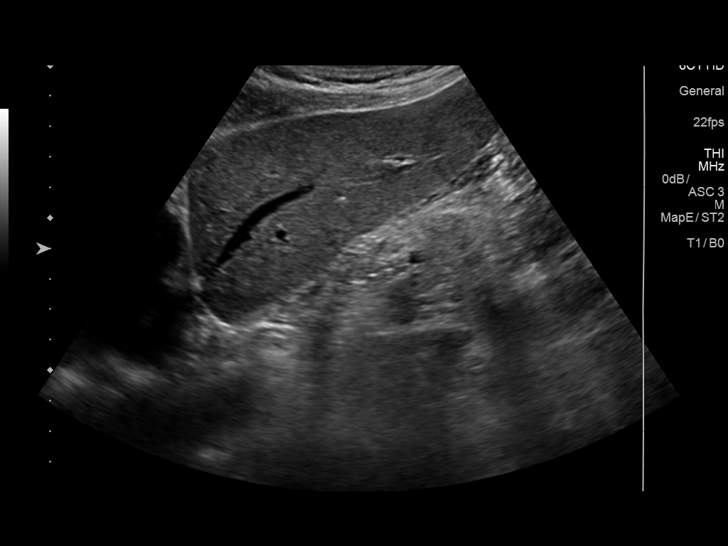
[im 13/29]
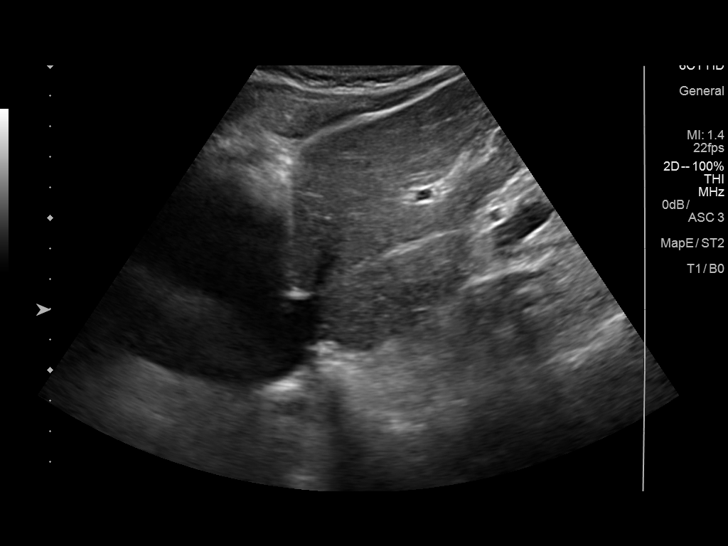
[im 16/29]
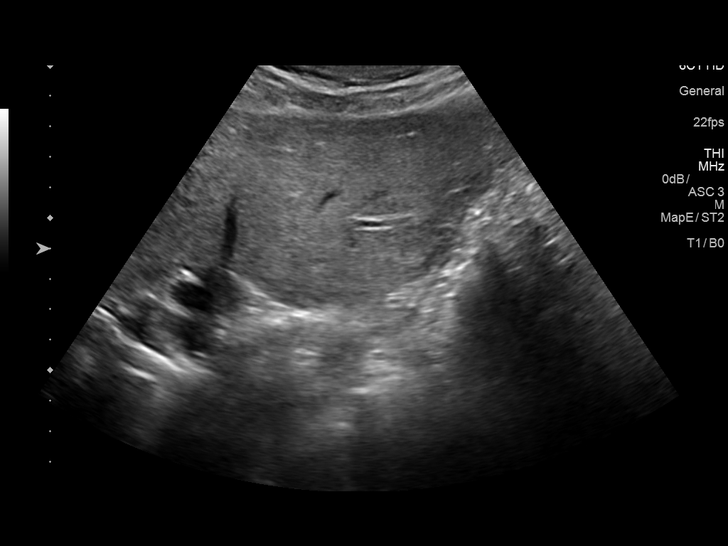
[im 18/29]
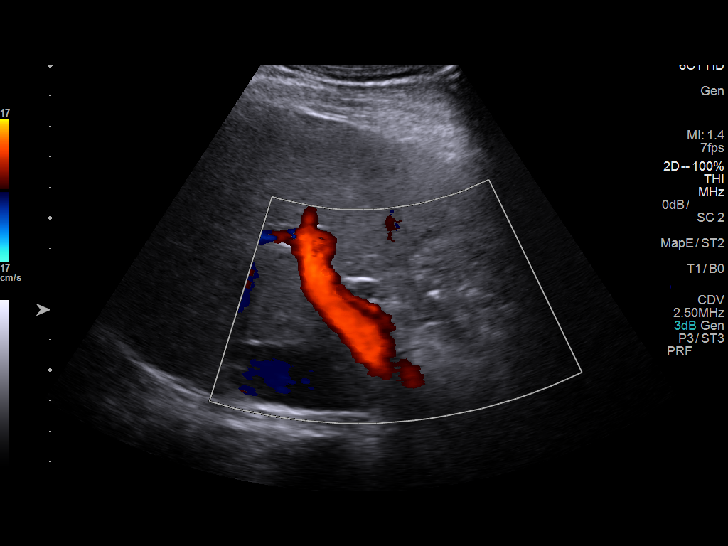
[im 19/29]
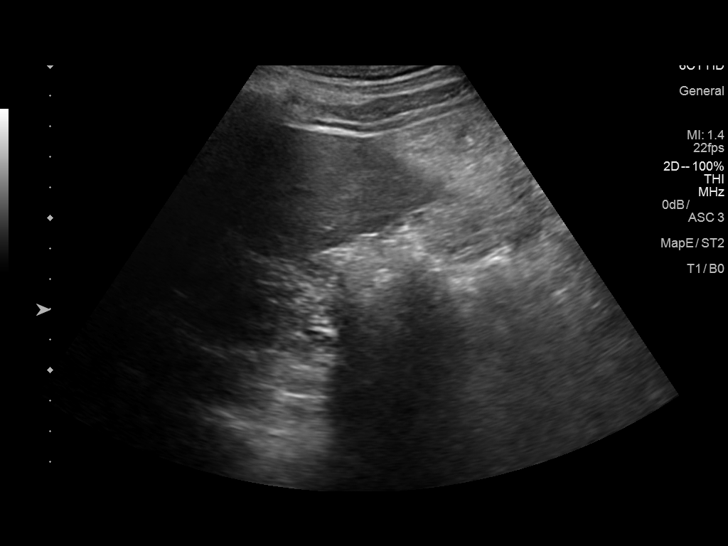
[im 22/29]
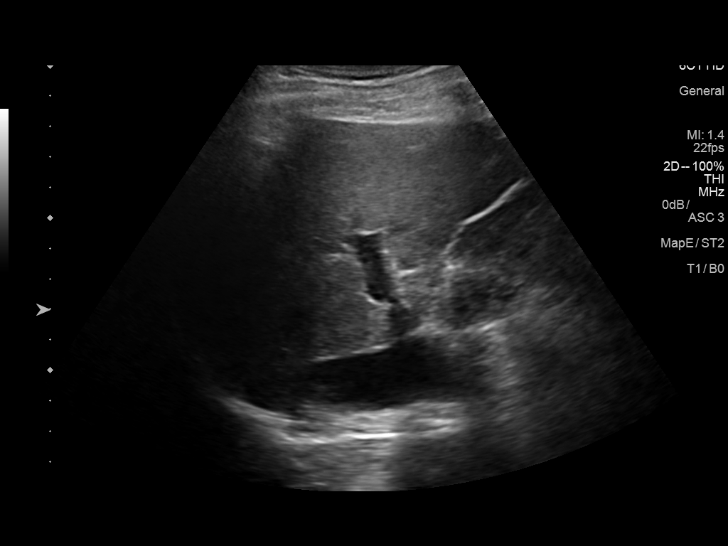
[im 24/29]
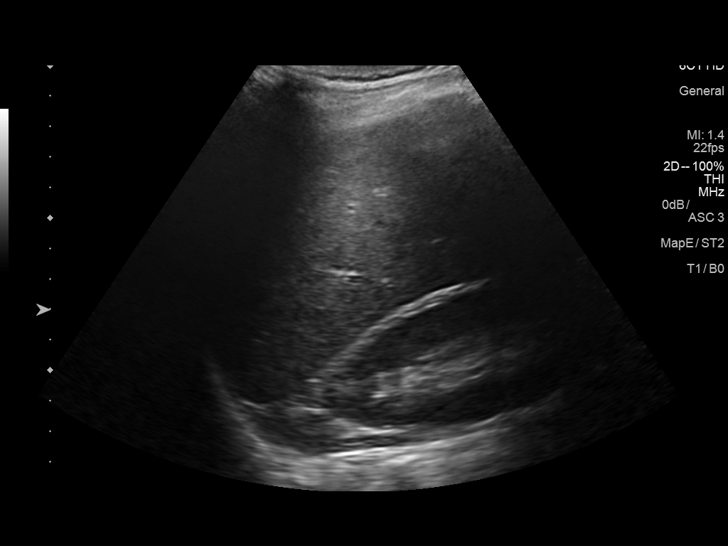
[im 26/29]
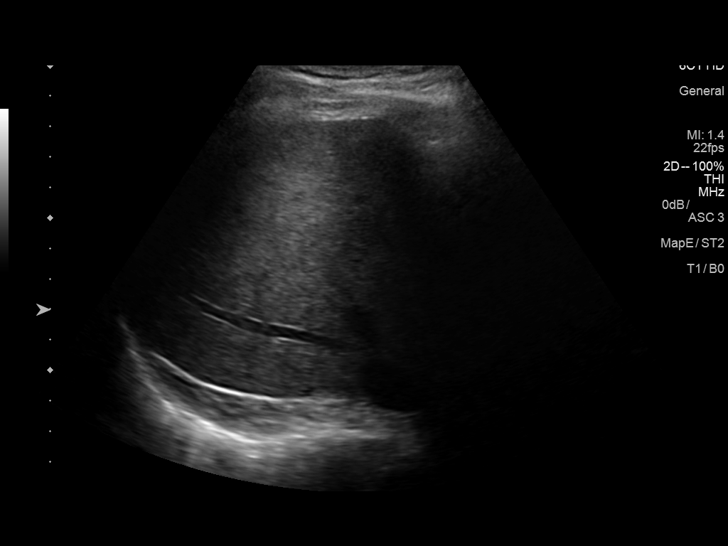
[im 29/29]
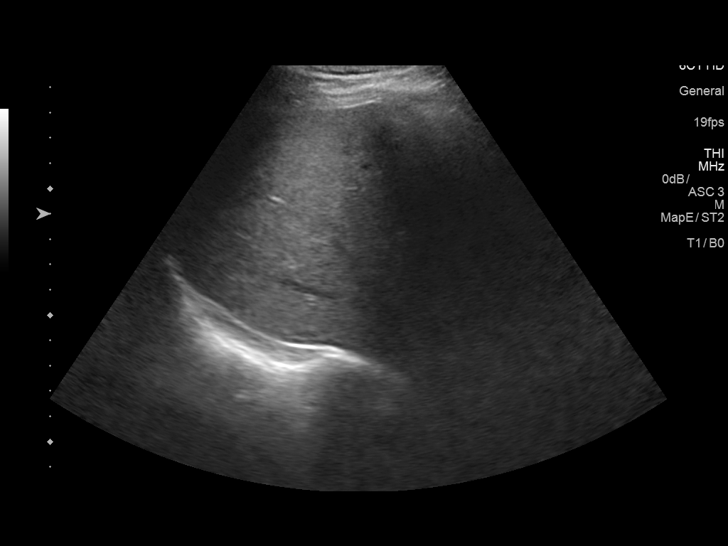

[14 of 25 positions shown; findings below may reference images not displayed]

FINDINGS: Gallbladder:

The gallbladder is surgically absent.

Common bile duct:

Diameter: 4.3 mm

Liver:

The hepatic echotexture is mildly increased. The surface contour of
the liver is smooth. There is no focal mass nor ductal dilation.
Portal vein is patent on color Doppler imaging with normal direction
of blood flow towards the liver.
IMPRESSION: Mildly increased hepatic echotexture consistent with known hepatic
fibrosis. No suspicious hepatic masses or surface contour
irregularity. No ascites in the right upper quadrant. Normal common
bile duct. Previous cholecystectomy.

## 2020-01-01 DIAGNOSIS — R52 Pain, unspecified: Secondary | ICD-10-CM | POA: Insufficient documentation

## 2020-01-01 DIAGNOSIS — M65342 Trigger finger, left ring finger: Secondary | ICD-10-CM | POA: Insufficient documentation

## 2020-06-04 DIAGNOSIS — Z01812 Encounter for preprocedural laboratory examination: Secondary | ICD-10-CM | POA: Diagnosis not present

## 2020-06-08 ENCOUNTER — Encounter (INDEPENDENT_AMBULATORY_CARE_PROVIDER_SITE_OTHER): Payer: Medicare Other | Admitting: Ophthalmology

## 2020-06-22 ENCOUNTER — Other Ambulatory Visit: Payer: Self-pay

## 2020-06-22 ENCOUNTER — Encounter (INDEPENDENT_AMBULATORY_CARE_PROVIDER_SITE_OTHER): Payer: Medicare Other | Admitting: Ophthalmology

## 2020-06-22 DIAGNOSIS — H43813 Vitreous degeneration, bilateral: Secondary | ICD-10-CM | POA: Diagnosis not present

## 2020-06-22 DIAGNOSIS — E113292 Type 2 diabetes mellitus with mild nonproliferative diabetic retinopathy without macular edema, left eye: Secondary | ICD-10-CM | POA: Diagnosis not present

## 2020-06-22 DIAGNOSIS — H2513 Age-related nuclear cataract, bilateral: Secondary | ICD-10-CM

## 2020-06-22 DIAGNOSIS — E113391 Type 2 diabetes mellitus with moderate nonproliferative diabetic retinopathy without macular edema, right eye: Secondary | ICD-10-CM | POA: Diagnosis not present

## 2020-06-24 DIAGNOSIS — E785 Hyperlipidemia, unspecified: Secondary | ICD-10-CM | POA: Diagnosis not present

## 2020-06-24 DIAGNOSIS — Z79899 Other long term (current) drug therapy: Secondary | ICD-10-CM | POA: Diagnosis not present

## 2020-06-24 DIAGNOSIS — E11311 Type 2 diabetes mellitus with unspecified diabetic retinopathy with macular edema: Secondary | ICD-10-CM | POA: Diagnosis not present

## 2020-06-24 DIAGNOSIS — E538 Deficiency of other specified B group vitamins: Secondary | ICD-10-CM | POA: Diagnosis not present

## 2020-06-24 DIAGNOSIS — Z7984 Long term (current) use of oral hypoglycemic drugs: Secondary | ICD-10-CM | POA: Diagnosis not present

## 2020-07-28 DIAGNOSIS — Z01812 Encounter for preprocedural laboratory examination: Secondary | ICD-10-CM | POA: Diagnosis not present

## 2020-07-30 DIAGNOSIS — K6289 Other specified diseases of anus and rectum: Secondary | ICD-10-CM | POA: Diagnosis not present

## 2020-07-30 DIAGNOSIS — K5289 Other specified noninfective gastroenteritis and colitis: Secondary | ICD-10-CM | POA: Diagnosis not present

## 2020-07-30 DIAGNOSIS — K640 First degree hemorrhoids: Secondary | ICD-10-CM | POA: Diagnosis not present

## 2020-07-30 DIAGNOSIS — K513 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: Secondary | ICD-10-CM | POA: Diagnosis not present

## 2020-08-04 DIAGNOSIS — K5289 Other specified noninfective gastroenteritis and colitis: Secondary | ICD-10-CM | POA: Diagnosis not present

## 2020-09-21 DIAGNOSIS — Z7984 Long term (current) use of oral hypoglycemic drugs: Secondary | ICD-10-CM | POA: Diagnosis not present

## 2020-09-21 DIAGNOSIS — E11311 Type 2 diabetes mellitus with unspecified diabetic retinopathy with macular edema: Secondary | ICD-10-CM | POA: Diagnosis not present

## 2020-09-21 DIAGNOSIS — Z79899 Other long term (current) drug therapy: Secondary | ICD-10-CM | POA: Diagnosis not present

## 2020-09-21 DIAGNOSIS — E785 Hyperlipidemia, unspecified: Secondary | ICD-10-CM | POA: Diagnosis not present

## 2020-09-21 DIAGNOSIS — E538 Deficiency of other specified B group vitamins: Secondary | ICD-10-CM | POA: Diagnosis not present

## 2020-09-30 DIAGNOSIS — E538 Deficiency of other specified B group vitamins: Secondary | ICD-10-CM | POA: Diagnosis not present

## 2020-10-08 DIAGNOSIS — K513 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: Secondary | ICD-10-CM | POA: Diagnosis not present

## 2020-10-14 DIAGNOSIS — E538 Deficiency of other specified B group vitamins: Secondary | ICD-10-CM | POA: Diagnosis not present

## 2021-01-29 DIAGNOSIS — Z794 Long term (current) use of insulin: Secondary | ICD-10-CM | POA: Diagnosis not present

## 2021-01-29 DIAGNOSIS — E1140 Type 2 diabetes mellitus with diabetic neuropathy, unspecified: Secondary | ICD-10-CM | POA: Diagnosis not present

## 2021-01-29 DIAGNOSIS — Z23 Encounter for immunization: Secondary | ICD-10-CM | POA: Diagnosis not present

## 2021-01-29 DIAGNOSIS — E785 Hyperlipidemia, unspecified: Secondary | ICD-10-CM | POA: Diagnosis not present

## 2021-01-29 DIAGNOSIS — R079 Chest pain, unspecified: Secondary | ICD-10-CM | POA: Diagnosis not present

## 2021-01-29 DIAGNOSIS — E11311 Type 2 diabetes mellitus with unspecified diabetic retinopathy with macular edema: Secondary | ICD-10-CM | POA: Diagnosis not present

## 2021-01-29 DIAGNOSIS — Z79899 Other long term (current) drug therapy: Secondary | ICD-10-CM | POA: Diagnosis not present

## 2021-01-29 DIAGNOSIS — E559 Vitamin D deficiency, unspecified: Secondary | ICD-10-CM | POA: Diagnosis not present

## 2021-01-29 DIAGNOSIS — E11319 Type 2 diabetes mellitus with unspecified diabetic retinopathy without macular edema: Secondary | ICD-10-CM | POA: Diagnosis not present

## 2021-01-29 DIAGNOSIS — E538 Deficiency of other specified B group vitamins: Secondary | ICD-10-CM | POA: Diagnosis not present

## 2021-01-29 DIAGNOSIS — K7460 Unspecified cirrhosis of liver: Secondary | ICD-10-CM | POA: Diagnosis not present

## 2021-01-29 DIAGNOSIS — Z0000 Encounter for general adult medical examination without abnormal findings: Secondary | ICD-10-CM | POA: Diagnosis not present

## 2021-05-12 DIAGNOSIS — E1142 Type 2 diabetes mellitus with diabetic polyneuropathy: Secondary | ICD-10-CM | POA: Diagnosis not present

## 2021-05-12 DIAGNOSIS — Z7984 Long term (current) use of oral hypoglycemic drugs: Secondary | ICD-10-CM | POA: Diagnosis not present

## 2021-05-12 DIAGNOSIS — E11319 Type 2 diabetes mellitus with unspecified diabetic retinopathy without macular edema: Secondary | ICD-10-CM | POA: Diagnosis not present

## 2021-05-12 DIAGNOSIS — D649 Anemia, unspecified: Secondary | ICD-10-CM | POA: Diagnosis not present

## 2021-05-19 ENCOUNTER — Emergency Department (HOSPITAL_BASED_OUTPATIENT_CLINIC_OR_DEPARTMENT_OTHER): Payer: Medicare Other

## 2021-05-19 ENCOUNTER — Other Ambulatory Visit: Payer: Self-pay

## 2021-05-19 ENCOUNTER — Encounter (HOSPITAL_BASED_OUTPATIENT_CLINIC_OR_DEPARTMENT_OTHER): Payer: Self-pay

## 2021-05-19 ENCOUNTER — Emergency Department (HOSPITAL_BASED_OUTPATIENT_CLINIC_OR_DEPARTMENT_OTHER)
Admission: EM | Admit: 2021-05-19 | Discharge: 2021-05-19 | Disposition: A | Discharge status: Home / Self Care | Payer: Medicare Other | Attending: Emergency Medicine | Admitting: Emergency Medicine

## 2021-05-19 DIAGNOSIS — Z794 Long term (current) use of insulin: Secondary | ICD-10-CM | POA: Insufficient documentation

## 2021-05-19 DIAGNOSIS — Z7984 Long term (current) use of oral hypoglycemic drugs: Secondary | ICD-10-CM | POA: Insufficient documentation

## 2021-05-19 DIAGNOSIS — Z7982 Long term (current) use of aspirin: Secondary | ICD-10-CM | POA: Insufficient documentation

## 2021-05-19 DIAGNOSIS — H53133 Sudden visual loss, bilateral: Secondary | ICD-10-CM | POA: Insufficient documentation

## 2021-05-19 DIAGNOSIS — R42 Dizziness and giddiness: Secondary | ICD-10-CM | POA: Diagnosis not present

## 2021-05-19 DIAGNOSIS — E119 Type 2 diabetes mellitus without complications: Secondary | ICD-10-CM | POA: Insufficient documentation

## 2021-05-19 DIAGNOSIS — R197 Diarrhea, unspecified: Secondary | ICD-10-CM | POA: Diagnosis not present

## 2021-05-19 DIAGNOSIS — R195 Other fecal abnormalities: Secondary | ICD-10-CM

## 2021-05-19 DIAGNOSIS — D649 Anemia, unspecified: Secondary | ICD-10-CM | POA: Insufficient documentation

## 2021-05-19 HISTORY — DX: Type 2 diabetes mellitus without complications: E11.9

## 2021-05-19 LAB — URINALYSIS, ROUTINE W REFLEX MICROSCOPIC
Bilirubin Urine: NEGATIVE
Glucose, UA: >1000 mg/dL — AB
Hgb urine dipstick: NEGATIVE
Ketones, ur: NEGATIVE mg/dL
Leukocytes,Ua: NEGATIVE
Nitrite: NEGATIVE
Specific Gravity, Urine: 1.046 — ABNORMAL HIGH (ref 1.005–1.030)
pH: 7 (ref 5.0–8.0)

## 2021-05-19 LAB — CBC
HCT: 33.8 % — ABNORMAL LOW (ref 39.0–52.0)
Hemoglobin: 11.2 g/dL — ABNORMAL LOW (ref 13.0–17.0)
MCH: 29.7 pg (ref 26.0–34.0)
MCHC: 33.1 g/dL (ref 30.0–36.0)
MCV: 89.7 fL (ref 80.0–100.0)
Platelets: 239 10*3/uL (ref 150–400)
RBC: 3.77 MIL/uL — ABNORMAL LOW (ref 4.22–5.81)
RDW: 14 % (ref 11.5–15.5)
WBC: 6.7 10*3/uL (ref 4.0–10.5)
nRBC: 0 % (ref 0.0–0.2)

## 2021-05-19 LAB — BASIC METABOLIC PANEL
Anion gap: 9 (ref 5–15)
BUN: 25 mg/dL — ABNORMAL HIGH (ref 8–23)
CO2: 24 mmol/L (ref 22–32)
Calcium: 9.4 mg/dL (ref 8.9–10.3)
Chloride: 106 mmol/L (ref 98–111)
Creatinine, Ser: 1.17 mg/dL (ref 0.61–1.24)
GFR, Estimated: >60 mL/min (ref >60)
Glucose, Bld: 110 mg/dL — ABNORMAL HIGH (ref 70–99)
Potassium: 4 mmol/L (ref 3.5–5.1)
Sodium: 139 mmol/L (ref 135–145)

## 2021-05-19 LAB — OCCULT BLOOD X 1 CARD TO LAB, STOOL: Fecal Occult Bld: NEGATIVE

## 2021-05-19 MED ORDER — IOHEXOL 350 MG/ML SOLN
75.0000 mL | Freq: Once | INTRAVENOUS | Status: AC | PRN
  Administered 2021-05-19: 75 mL via INTRAVENOUS

## 2021-05-19 NOTE — Discharge Instructions (Addendum)
You were evaluated in the Emergency Department and after careful evaluation, we did not find any emergent condition requiring admission or further testing in the hospital.  Your exam/testing today was overall reassuring. 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 I spoke with on call neurology who did not feel that your symptoms warrant furthering imaging workup and are less concerning for acute stroke or TIA.  You do have a low hemoglobin to 11.2 today.  Your rectal exam was negative for evidence of active gastrointestinal bleeding and your occult blood card testing was also negative.  Recommend routine PCP and gastroenterology follow-up.  Please return to the Emergency Department if you experience any worsening of your condition.  Thank you for allowing [place] to be a part of your care.

## 2021-05-19 NOTE — ED Triage Notes (Signed)
Pt sent here by his PCP for CT scan of his head and abd. Pt reports last Friday (5 days ago) while driving to work he had a single episode of a bright circular flash "go off" in front of him with colorful rainbow colors around it. No other episode since. Associated with generalized weakness, denies unilateral weakness that lasted until yesterday. Woke up today and feels better. Pt called his PCP yesterday, they called him this am and sent him here for further evaluation.   Pt also reports dark tarry stool x1 month or more. Pt reports it was "lumpy" until this incident and is now watery. Pt takes 325 mg ASA daily   Pt reports significant hx of dizziness and blurred vision, PCP aware but unable to determine the cause   No facial droop noted, grips and strengths equal bilaterally, no arm/leg drift.
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
## 2021-06-22 ENCOUNTER — Encounter (INDEPENDENT_AMBULATORY_CARE_PROVIDER_SITE_OTHER): Payer: Medicare Other | Admitting: Ophthalmology

## 2021-06-22 ENCOUNTER — Other Ambulatory Visit: Payer: Self-pay

## 2021-06-22 DIAGNOSIS — H43813 Vitreous degeneration, bilateral: Secondary | ICD-10-CM | POA: Diagnosis not present

## 2021-06-22 DIAGNOSIS — E113213 Type 2 diabetes mellitus with mild nonproliferative diabetic retinopathy with macular edema, bilateral: Secondary | ICD-10-CM

## 2021-06-22 DIAGNOSIS — H2513 Age-related nuclear cataract, bilateral: Secondary | ICD-10-CM

## 2021-09-24 DIAGNOSIS — M545 Low back pain, unspecified: Secondary | ICD-10-CM | POA: Insufficient documentation

## 2021-09-24 DIAGNOSIS — M25552 Pain in left hip: Secondary | ICD-10-CM | POA: Insufficient documentation

## 2021-10-20 DIAGNOSIS — M5416 Radiculopathy, lumbar region: Secondary | ICD-10-CM | POA: Insufficient documentation

## 2021-11-15 ENCOUNTER — Other Ambulatory Visit (HOSPITAL_COMMUNITY): Payer: Self-pay

## 2021-11-17 ENCOUNTER — Other Ambulatory Visit (HOSPITAL_COMMUNITY): Payer: Self-pay

## 2021-11-17 MED ORDER — DAPAGLIFLOZIN PROPANEDIOL 5 MG PO TABS
ORAL_TABLET | ORAL | 1 refills | Status: DC
  Filled 2021-11-17: qty 90, 90d supply, fill #0

## 2021-11-18 ENCOUNTER — Other Ambulatory Visit (HOSPITAL_COMMUNITY): Payer: Self-pay

## 2021-11-18 MED ORDER — METFORMIN HCL 1000 MG PO TABS
ORAL_TABLET | ORAL | 3 refills | Status: DC
  Filled 2022-02-02: qty 180, 90d supply, fill #0
  Filled 2022-05-10: qty 180, 90d supply, fill #1

## 2021-11-18 MED ORDER — OLMESARTAN MEDOXOMIL 5 MG PO TABS
ORAL_TABLET | ORAL | 3 refills | Status: DC
  Filled 2022-04-22: qty 180, 90d supply, fill #0

## 2021-11-18 MED ORDER — TAMSULOSIN HCL 0.4 MG PO CAPS
ORAL_CAPSULE | ORAL | 3 refills | Status: DC
  Filled 2021-11-18: qty 90, 90d supply, fill #0

## 2021-11-19 ENCOUNTER — Other Ambulatory Visit (HOSPITAL_COMMUNITY): Payer: Self-pay

## 2021-11-24 ENCOUNTER — Other Ambulatory Visit (HOSPITAL_COMMUNITY): Payer: Self-pay

## 2021-11-25 ENCOUNTER — Other Ambulatory Visit (HOSPITAL_COMMUNITY): Payer: Self-pay

## 2021-11-26 ENCOUNTER — Other Ambulatory Visit (HOSPITAL_COMMUNITY): Payer: Self-pay

## 2021-11-29 ENCOUNTER — Other Ambulatory Visit (HOSPITAL_COMMUNITY): Payer: Self-pay

## 2021-11-29 ENCOUNTER — Other Ambulatory Visit (HOSPITAL_BASED_OUTPATIENT_CLINIC_OR_DEPARTMENT_OTHER): Payer: Self-pay

## 2021-11-29 MED ORDER — NOVOLIN 70/30 RELION (70-30) 100 UNIT/ML ~~LOC~~ SUSP
SUBCUTANEOUS | 5 refills | Status: DC
  Filled 2021-11-29: qty 20, 90d supply, fill #0
  Filled 2021-12-03: qty 10, 50d supply, fill #0
  Filled 2021-12-03: qty 30, 15d supply, fill #0
  Filled 2022-01-11: qty 10, 50d supply, fill #1
  Filled 2022-02-23: qty 10, 50d supply, fill #2
  Filled 2022-05-10 – 2022-08-22 (×3): qty 10, 50d supply, fill #3

## 2021-12-03 ENCOUNTER — Other Ambulatory Visit (HOSPITAL_BASED_OUTPATIENT_CLINIC_OR_DEPARTMENT_OTHER): Payer: Self-pay

## 2021-12-03 ENCOUNTER — Other Ambulatory Visit (HOSPITAL_COMMUNITY): Payer: Self-pay

## 2021-12-03 ENCOUNTER — Other Ambulatory Visit: Payer: Self-pay

## 2021-12-03 MED ORDER — "INSULIN SYRINGE-NEEDLE U-100 31G X 5/16"" 0.3 ML MISC"
5 refills | Status: AC
  Filled 2021-12-03: qty 100, 50d supply, fill #0
  Filled 2022-03-14: qty 100, 50d supply, fill #1

## 2021-12-06 ENCOUNTER — Other Ambulatory Visit (HOSPITAL_COMMUNITY): Payer: Self-pay

## 2022-01-11 ENCOUNTER — Other Ambulatory Visit (HOSPITAL_COMMUNITY): Payer: Self-pay

## 2022-01-12 ENCOUNTER — Other Ambulatory Visit (HOSPITAL_COMMUNITY): Payer: Self-pay

## 2022-02-02 ENCOUNTER — Other Ambulatory Visit (HOSPITAL_COMMUNITY): Payer: Self-pay

## 2022-02-03 ENCOUNTER — Other Ambulatory Visit (HOSPITAL_BASED_OUTPATIENT_CLINIC_OR_DEPARTMENT_OTHER): Payer: Self-pay

## 2022-02-03 ENCOUNTER — Other Ambulatory Visit (HOSPITAL_COMMUNITY): Payer: Self-pay

## 2022-02-03 MED ORDER — FENOFIBRATE 145 MG PO TABS
145.0000 mg | ORAL_TABLET | Freq: Every day | ORAL | 0 refills | Status: DC
  Filled 2022-02-03 (×2): qty 90, 90d supply, fill #0

## 2022-02-11 DIAGNOSIS — H2511 Age-related nuclear cataract, right eye: Secondary | ICD-10-CM | POA: Diagnosis not present

## 2022-02-11 DIAGNOSIS — H25011 Cortical age-related cataract, right eye: Secondary | ICD-10-CM | POA: Diagnosis not present

## 2022-02-11 DIAGNOSIS — H5203 Hypermetropia, bilateral: Secondary | ICD-10-CM | POA: Diagnosis not present

## 2022-02-23 ENCOUNTER — Other Ambulatory Visit (HOSPITAL_COMMUNITY): Payer: Self-pay

## 2022-02-24 ENCOUNTER — Other Ambulatory Visit (HOSPITAL_COMMUNITY): Payer: Self-pay

## 2022-03-08 DIAGNOSIS — H2511 Age-related nuclear cataract, right eye: Secondary | ICD-10-CM | POA: Diagnosis not present

## 2022-03-08 DIAGNOSIS — H25811 Combined forms of age-related cataract, right eye: Secondary | ICD-10-CM | POA: Diagnosis not present

## 2022-03-08 DIAGNOSIS — H269 Unspecified cataract: Secondary | ICD-10-CM | POA: Diagnosis not present

## 2022-03-08 DIAGNOSIS — H25011 Cortical age-related cataract, right eye: Secondary | ICD-10-CM | POA: Diagnosis not present

## 2022-03-08 DIAGNOSIS — Z961 Presence of intraocular lens: Secondary | ICD-10-CM | POA: Diagnosis not present

## 2022-03-14 ENCOUNTER — Other Ambulatory Visit (HOSPITAL_COMMUNITY): Payer: Self-pay

## 2022-03-15 ENCOUNTER — Other Ambulatory Visit (HOSPITAL_COMMUNITY): Payer: Self-pay

## 2022-03-17 DIAGNOSIS — Z8619 Personal history of other infectious and parasitic diseases: Secondary | ICD-10-CM | POA: Diagnosis not present

## 2022-03-17 DIAGNOSIS — D649 Anemia, unspecified: Secondary | ICD-10-CM | POA: Diagnosis not present

## 2022-03-17 DIAGNOSIS — E559 Vitamin D deficiency, unspecified: Secondary | ICD-10-CM | POA: Diagnosis not present

## 2022-03-17 DIAGNOSIS — N401 Enlarged prostate with lower urinary tract symptoms: Secondary | ICD-10-CM | POA: Diagnosis not present

## 2022-03-17 DIAGNOSIS — E538 Deficiency of other specified B group vitamins: Secondary | ICD-10-CM | POA: Diagnosis not present

## 2022-03-17 DIAGNOSIS — E1165 Type 2 diabetes mellitus with hyperglycemia: Secondary | ICD-10-CM | POA: Diagnosis not present

## 2022-03-17 DIAGNOSIS — E11311 Type 2 diabetes mellitus with unspecified diabetic retinopathy with macular edema: Secondary | ICD-10-CM | POA: Diagnosis not present

## 2022-03-17 DIAGNOSIS — I10 Essential (primary) hypertension: Secondary | ICD-10-CM | POA: Diagnosis not present

## 2022-03-17 DIAGNOSIS — E11319 Type 2 diabetes mellitus with unspecified diabetic retinopathy without macular edema: Secondary | ICD-10-CM | POA: Diagnosis not present

## 2022-03-17 DIAGNOSIS — E785 Hyperlipidemia, unspecified: Secondary | ICD-10-CM | POA: Diagnosis not present

## 2022-03-24 DIAGNOSIS — I651 Occlusion and stenosis of basilar artery: Secondary | ICD-10-CM | POA: Diagnosis not present

## 2022-03-24 DIAGNOSIS — E1140 Type 2 diabetes mellitus with diabetic neuropathy, unspecified: Secondary | ICD-10-CM | POA: Diagnosis not present

## 2022-03-24 DIAGNOSIS — Z0000 Encounter for general adult medical examination without abnormal findings: Secondary | ICD-10-CM | POA: Diagnosis not present

## 2022-03-24 DIAGNOSIS — E785 Hyperlipidemia, unspecified: Secondary | ICD-10-CM | POA: Diagnosis not present

## 2022-03-24 DIAGNOSIS — K51319 Ulcerative (chronic) rectosigmoiditis with unspecified complications: Secondary | ICD-10-CM | POA: Diagnosis not present

## 2022-03-24 DIAGNOSIS — R5383 Other fatigue: Secondary | ICD-10-CM | POA: Diagnosis not present

## 2022-03-24 DIAGNOSIS — E11311 Type 2 diabetes mellitus with unspecified diabetic retinopathy with macular edema: Secondary | ICD-10-CM | POA: Diagnosis not present

## 2022-03-24 DIAGNOSIS — F330 Major depressive disorder, recurrent, mild: Secondary | ICD-10-CM | POA: Diagnosis not present

## 2022-03-24 DIAGNOSIS — N401 Enlarged prostate with lower urinary tract symptoms: Secondary | ICD-10-CM | POA: Diagnosis not present

## 2022-03-24 DIAGNOSIS — I6602 Occlusion and stenosis of left middle cerebral artery: Secondary | ICD-10-CM | POA: Diagnosis not present

## 2022-03-24 DIAGNOSIS — E538 Deficiency of other specified B group vitamins: Secondary | ICD-10-CM | POA: Diagnosis not present

## 2022-03-24 DIAGNOSIS — K7460 Unspecified cirrhosis of liver: Secondary | ICD-10-CM | POA: Diagnosis not present

## 2022-03-25 ENCOUNTER — Other Ambulatory Visit (HOSPITAL_COMMUNITY): Payer: Self-pay

## 2022-03-29 ENCOUNTER — Other Ambulatory Visit (HOSPITAL_COMMUNITY): Payer: Self-pay

## 2022-03-29 MED ORDER — FARXIGA 5 MG PO TABS
5.0000 mg | ORAL_TABLET | Freq: Every day | ORAL | 3 refills | Status: AC
  Filled 2022-03-29: qty 90, 90d supply, fill #0
  Filled 2022-06-27: qty 90, 90d supply, fill #1
  Filled 2022-09-23: qty 90, 90d supply, fill #2
  Filled 2022-12-08 – 2023-02-01 (×3): qty 90, 90d supply, fill #3

## 2022-04-08 ENCOUNTER — Other Ambulatory Visit (HOSPITAL_COMMUNITY): Payer: Self-pay

## 2022-04-22 ENCOUNTER — Other Ambulatory Visit (HOSPITAL_COMMUNITY): Payer: Self-pay

## 2022-04-25 ENCOUNTER — Other Ambulatory Visit (HOSPITAL_COMMUNITY): Payer: Self-pay

## 2022-05-02 DIAGNOSIS — Z23 Encounter for immunization: Secondary | ICD-10-CM | POA: Diagnosis not present

## 2022-05-02 DIAGNOSIS — E162 Hypoglycemia, unspecified: Secondary | ICD-10-CM | POA: Diagnosis not present

## 2022-05-02 DIAGNOSIS — F330 Major depressive disorder, recurrent, mild: Secondary | ICD-10-CM | POA: Diagnosis not present

## 2022-05-02 DIAGNOSIS — E11311 Type 2 diabetes mellitus with unspecified diabetic retinopathy with macular edema: Secondary | ICD-10-CM | POA: Diagnosis not present

## 2022-05-10 ENCOUNTER — Other Ambulatory Visit (HOSPITAL_COMMUNITY): Payer: Self-pay

## 2022-05-10 ENCOUNTER — Other Ambulatory Visit: Payer: Self-pay

## 2022-05-10 MED ORDER — FENOFIBRATE 145 MG PO TABS
145.0000 mg | ORAL_TABLET | Freq: Every day | ORAL | 3 refills | Status: DC
  Filled 2022-05-10: qty 90, 90d supply, fill #0
  Filled 2022-09-09: qty 90, 90d supply, fill #1
  Filled 2022-12-08: qty 90, 90d supply, fill #2
  Filled 2023-03-09: qty 90, 90d supply, fill #3

## 2022-05-23 ENCOUNTER — Other Ambulatory Visit (HOSPITAL_COMMUNITY): Payer: Self-pay

## 2022-05-23 MED ORDER — ROSUVASTATIN CALCIUM 10 MG PO TABS
10.0000 mg | ORAL_TABLET | Freq: Every day | ORAL | 3 refills | Status: DC
  Filled 2022-05-23: qty 90, 90d supply, fill #0
  Filled 2022-08-11 – 2022-08-12 (×3): qty 90, 90d supply, fill #1
  Filled 2022-11-07: qty 90, 90d supply, fill #2

## 2022-05-31 DIAGNOSIS — H2181 Floppy iris syndrome: Secondary | ICD-10-CM | POA: Diagnosis not present

## 2022-05-31 DIAGNOSIS — Z961 Presence of intraocular lens: Secondary | ICD-10-CM | POA: Diagnosis not present

## 2022-05-31 DIAGNOSIS — H2512 Age-related nuclear cataract, left eye: Secondary | ICD-10-CM | POA: Diagnosis not present

## 2022-05-31 DIAGNOSIS — H21562 Pupillary abnormality, left eye: Secondary | ICD-10-CM | POA: Diagnosis not present

## 2022-05-31 DIAGNOSIS — H269 Unspecified cataract: Secondary | ICD-10-CM | POA: Diagnosis not present

## 2022-06-04 IMAGING — CT CT ANGIO HEAD-NECK (W OR W/O PERF)
2 of 7 series · 8 of 33 positions shown · IV contrast (APPLIED)
Comparison: Same day CT head.  MRI/MRA 01/20/2010.

CLINICAL DATA: Stroke, follow up

EXAM:
CT ANGIOGRAPHY HEAD AND NECK
TECHNIQUE: Multidetector CT imaging of the head and neck was performed using
the standard protocol during bolus administration of intravenous
contrast. Multiplanar CT image reconstructions and MIPs were
obtained to evaluate the vascular anatomy. Carotid stenosis
measurements (when applicable) are obtained utilizing NASCET
criteria, using the distal internal carotid diameter as the
denominator.
CONTRAST:  75mL OMNIPAQUE IOHEXOL 350 MG/ML SOLN

[Series 4: cta head · axial · 0.49mm/px · z∈[-139,-17]mm · 2 of 184 slices shown]
[im 62/184  soft-tissue]
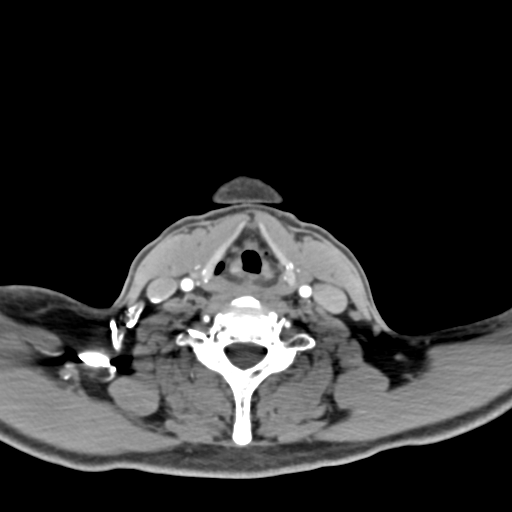
[im 123/184  soft-tissue]
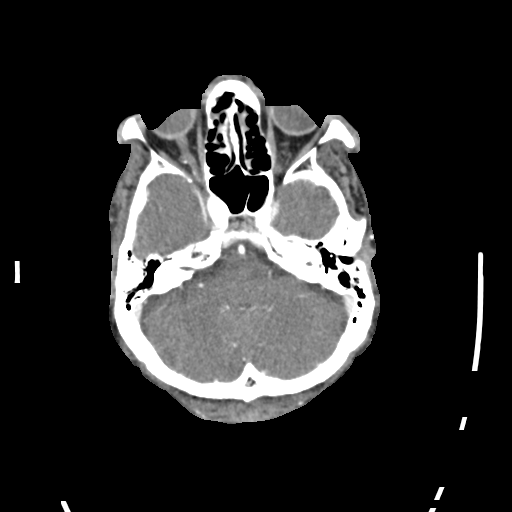

[Series 6: ax thin · axial · 0.55mm/px · z∈[-246,+18]mm · 6 of 379 slices shown]
[im 55/379  soft-tissue]
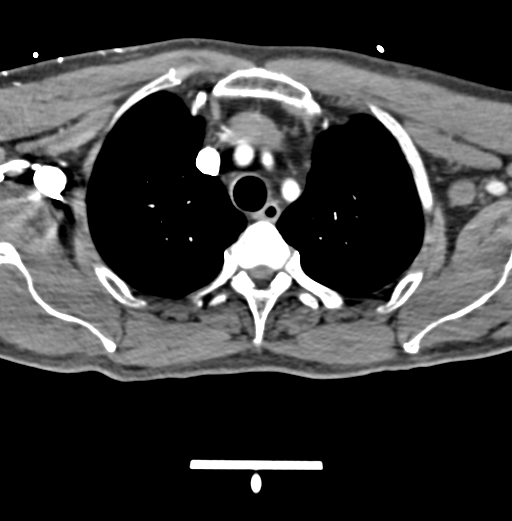
[im 109/379  bone]
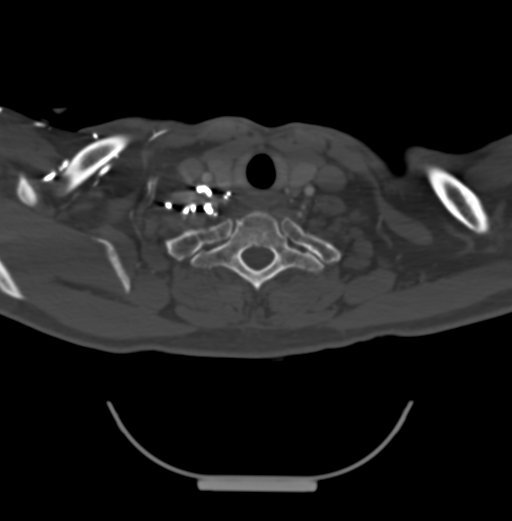
[im 163/379  soft-tissue]
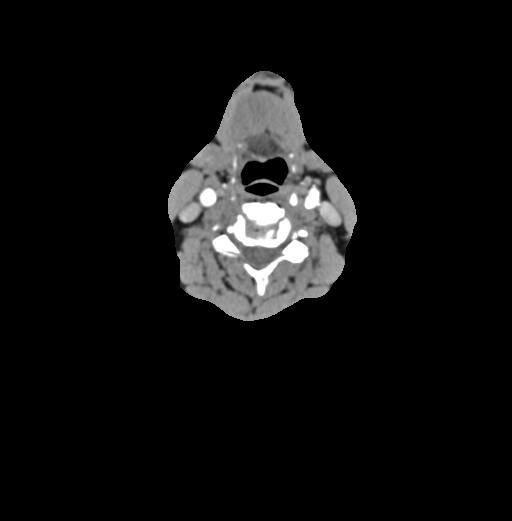
[im 217/379  bone]
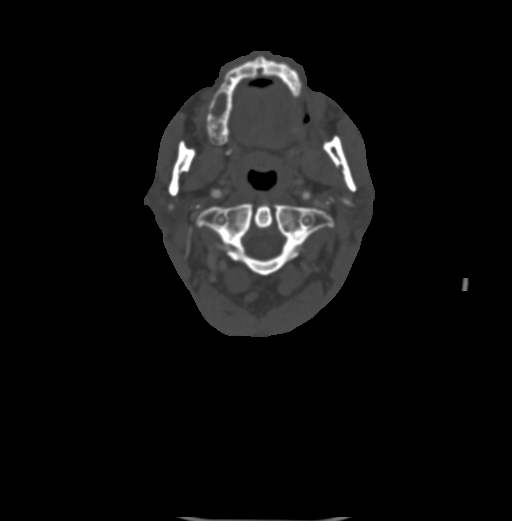
[im 271/379  soft-tissue]
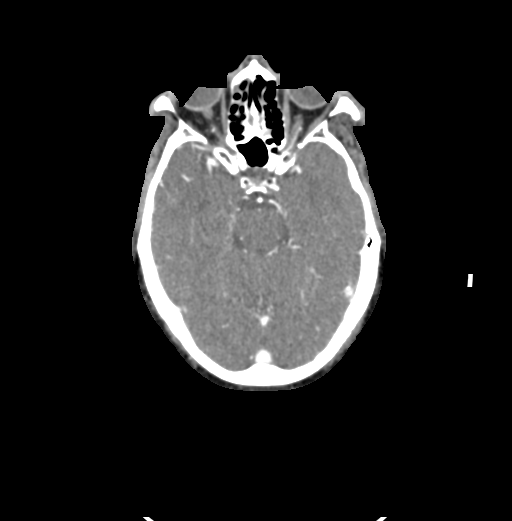
[im 325/379  bone]
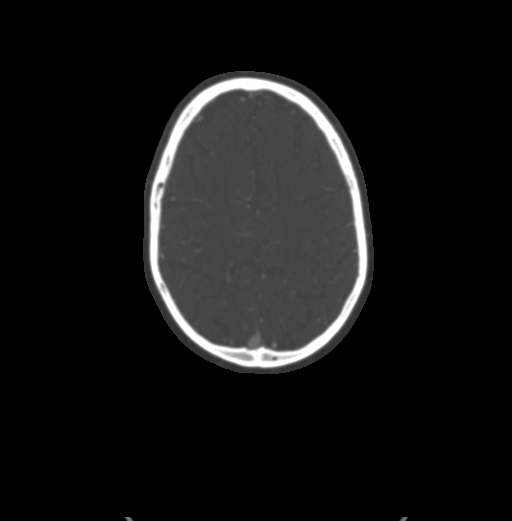

[8 of 33 positions shown; findings below may reference images not displayed]

FINDINGS: CTA NECK FINDINGS

Aortic arch: Great vessel origins are patent without significant
stenosis.

Right carotid system: Proximal right common carotid artery is
obscured by streak artifact. No visible significant (greater than
50%) stenosis.

Left carotid system: Mild calcific atherosclerosis at the carotid
bifurcation without greater than 50% stenosis.

Vertebral arteries: Portions of the proximal right vertebral artery
are obscured by streak artifact. Mildly right dominant.
Approximately 55% stenosis of the left ICA at the C4-C5 level.

Skeleton: Multilevel degenerative change in the mid and upper
cervical spine, including bridging anterior osteophytes.

Other neck: No acute abnormality.

Upper chest: Visualized lung apices clear.

Review of the MIP images confirms the above findings

CTA HEAD FINDINGS

Anterior circulation: Bilateral intracranial ICAs, MCAs, and ACAs
are patent without proximal hemodynamically significant stenosis.
Partially azygous ACA, anatomic variant. No aneurysm identified.

Posterior circulation: Bilateral intradural vertebral arteries,
basilar artery and posterior cerebral arteries are patent without
proximal hemodynamically significant stenosis. Small bilateral P1
PCAs with posterior communicating arteries, anatomic variant. No
aneurysm identified.

Venous sinuses: As permitted by contrast timing, patent.

Anatomic variants: Detailed above.

Review of the MIP images confirms the above findings
IMPRESSION: CTA head:

No large vessel occlusion or proximal hemodynamically significant
stenosis

CTA neck:

1. Approximately 55% stenosis of the left vertebral artery at the
C4-C5 level.
2. Otherwise, no evidence of greater than 50% stenosis with
nonvisualization of portions of the proximal right ICA and vertebral
artery due to streak artifact.

## 2022-06-04 IMAGING — CT CT HEAD W/O CM
4 series · 16 of 47 positions shown, 18 images · non-contrast
Comparison: Brain MRI 01/24/2010

CLINICAL DATA: Vision changes, weakness

EXAM:
CT HEAD WITHOUT CONTRAST
TECHNIQUE: Contiguous axial images were obtained from the base of the skull
through the vertex without intravenous contrast.

[Series 2: head wo · axial · 0.41mm/px · z∈[-22,+93]mm · 7 of 31 slices shown, 9 images]
[im 4/31  brain]
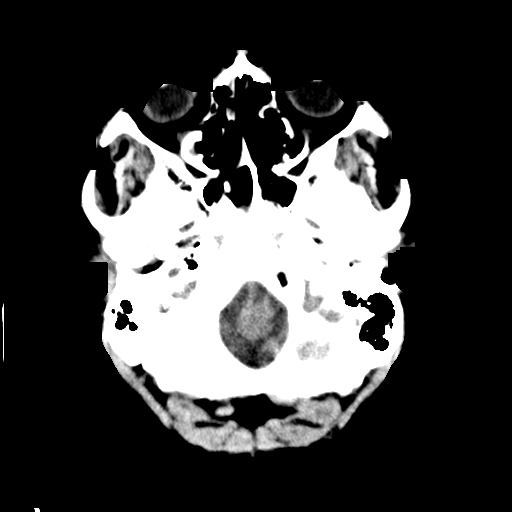
[im 4/31  bone]
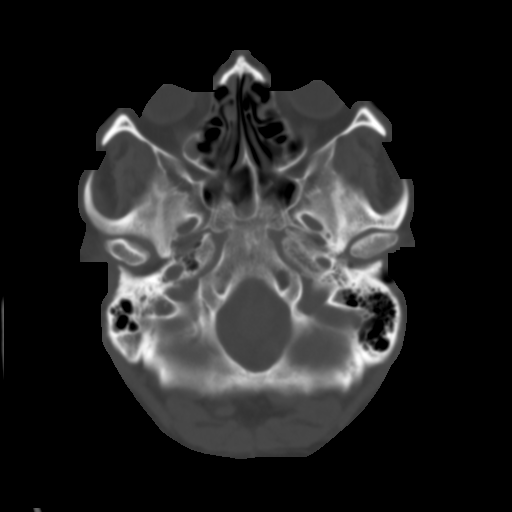
[im 8/31  brain]
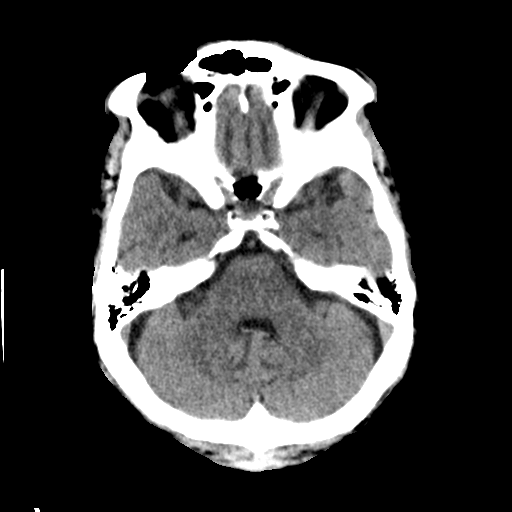
[im 12/31  brain]
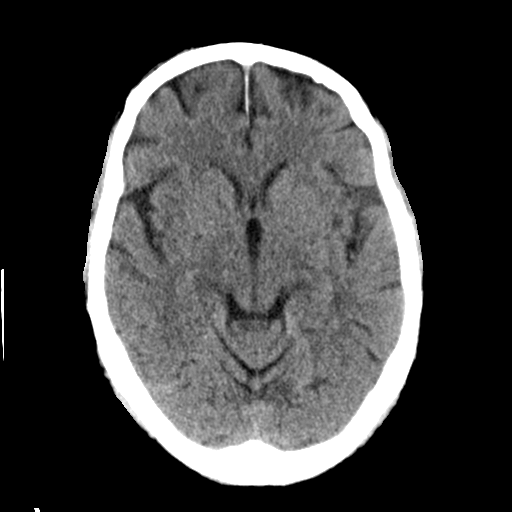
[im 16/31  brain]
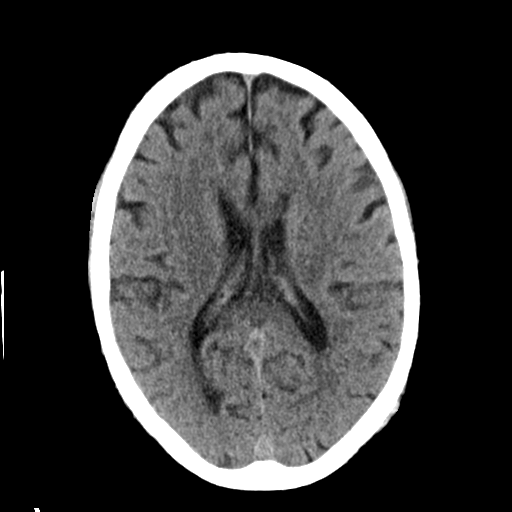
[im 19/31  brain]
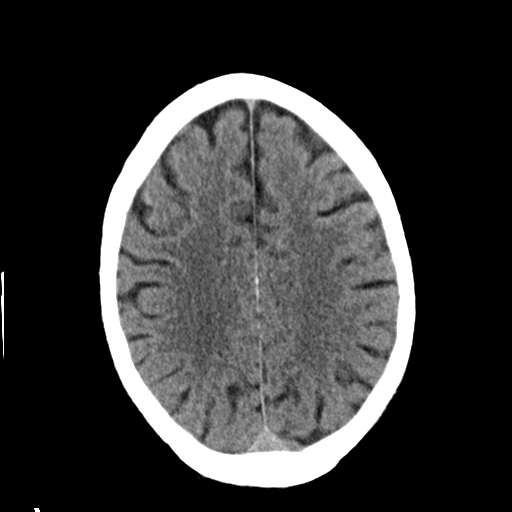
[im 19/31  bone]
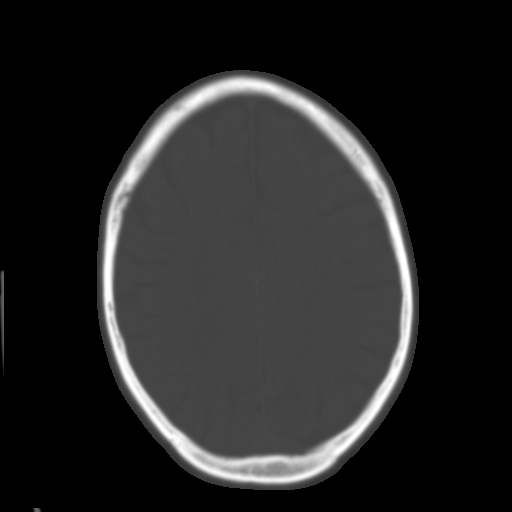
[im 23/31  brain]
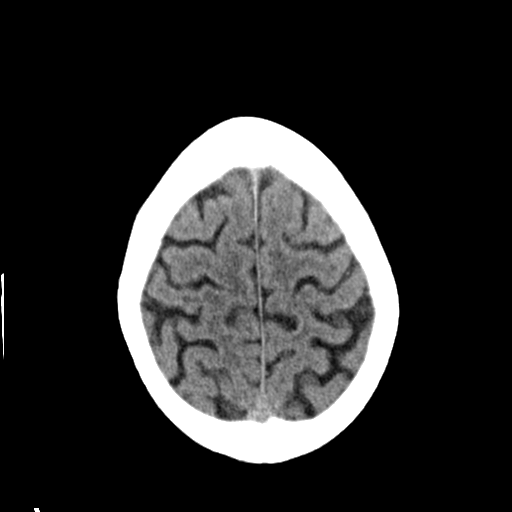
[im 27/31  brain]
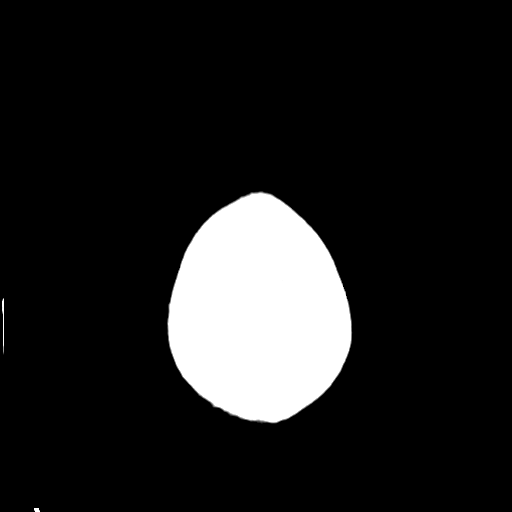

[Series 3: head bone · axial · 0.41mm/px · z∈[-23,+7]mm · 3 of 77 slices shown]
[im 8/77  bone]
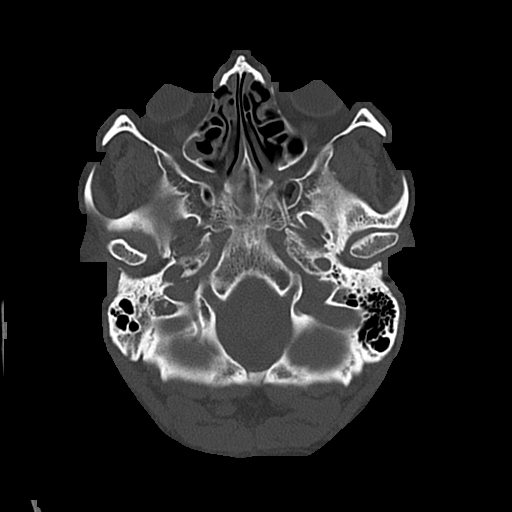
[im 16/77  bone]
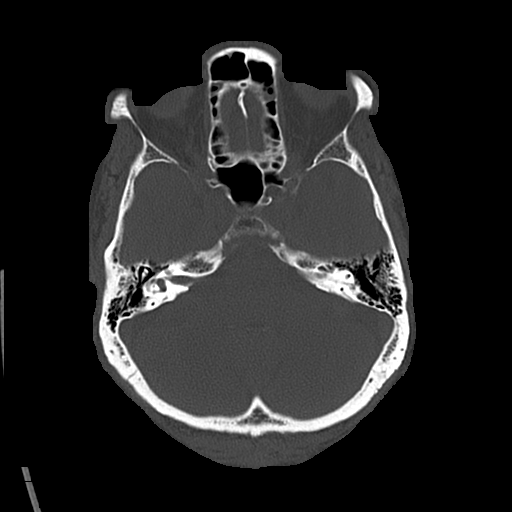
[im 23/77  bone]
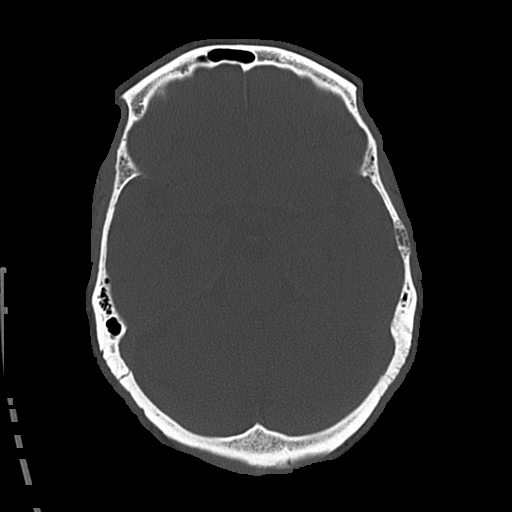

[Series 4: coronal soft · coronal · 0.29mm/px · 3 of 66 slices shown]
[im 22/66  brain]
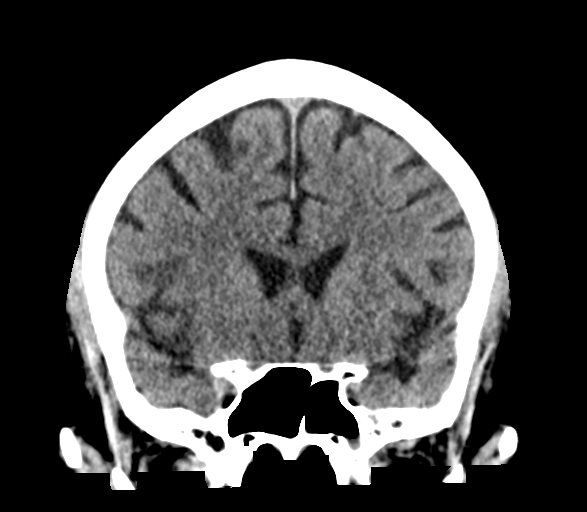
[im 29/66  brain]
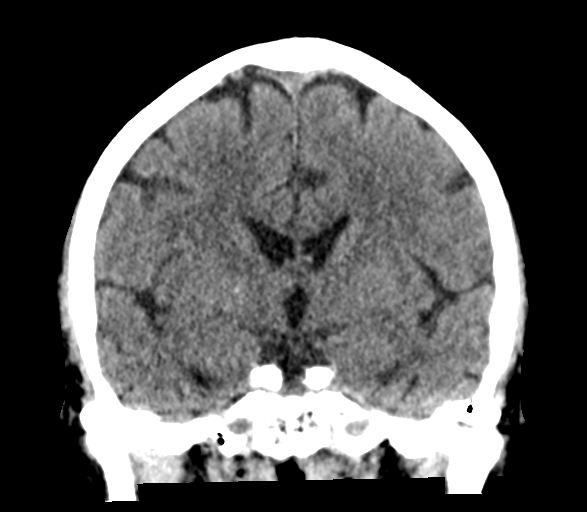
[im 37/66  brain]
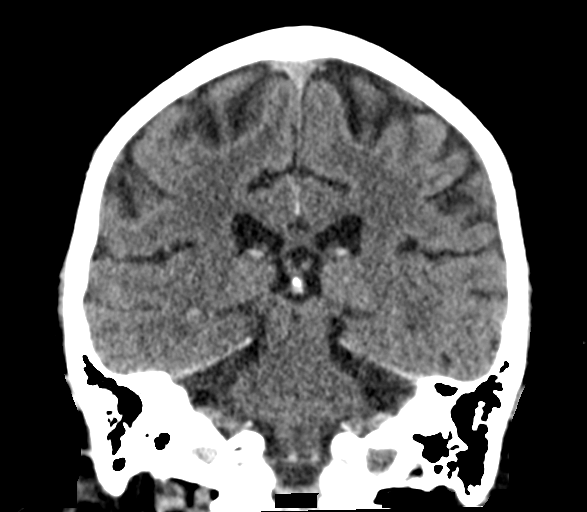

[Series 5: sagittal soft · sagittal · 0.29mm/px · 3 of 58 slices shown]
[im 20/58  brain]
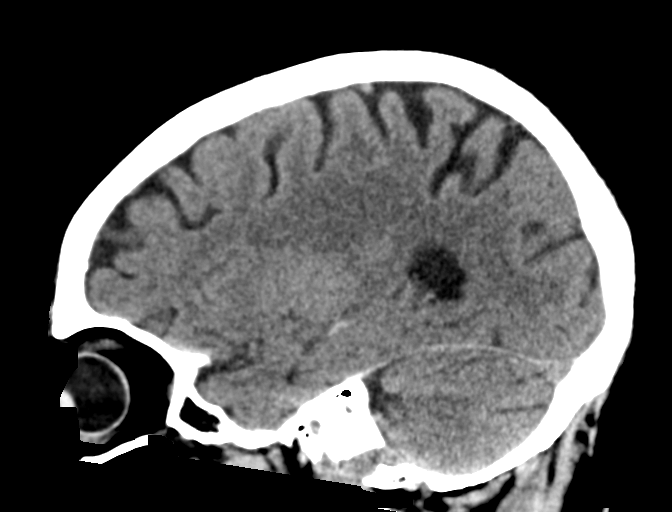
[im 29/58  brain]
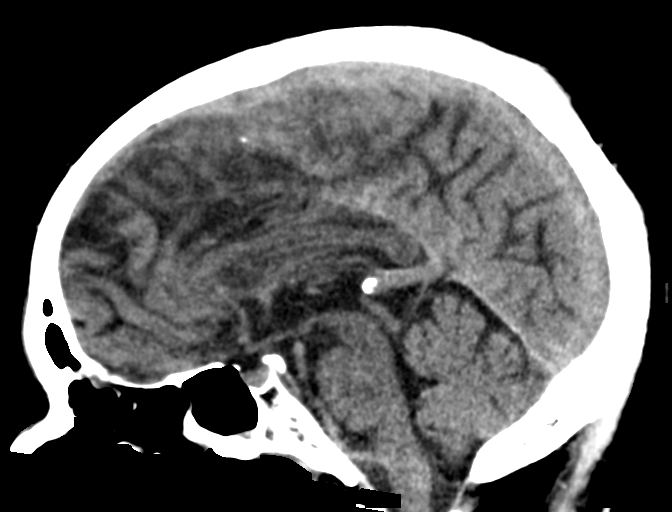
[im 39/58  brain]
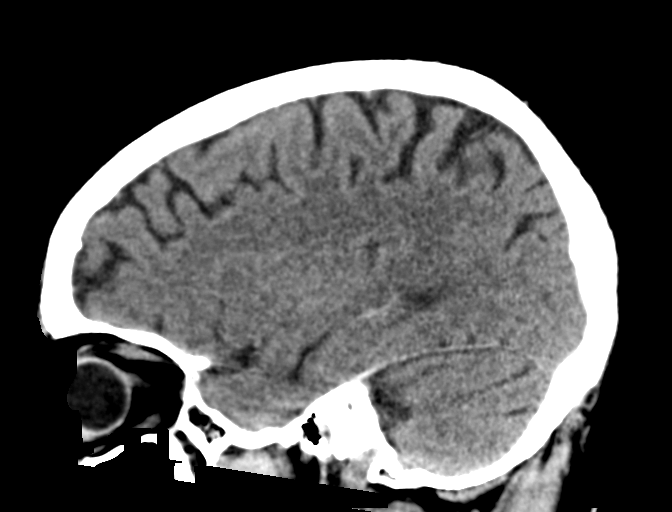

[16 of 47 positions shown; findings below may reference images not displayed]

FINDINGS: Brain: There is no evidence of acute intracranial hemorrhage,
extra-axial fluid collection, or acute infarct.

Parenchymal volume is normal. The ventricles are normal in size.
There is no mass lesion. There is no midline shift.

Vascular: No hyperdense vessel or unexpected calcification.

Skull: Normal. Negative for fracture or focal lesion.

Sinuses/Orbits: There is mucosal thickening throughout the paranasal
sinuses with layering fluid in the maxillary sinuses. The globes and
orbits are unremarkable.

Other: None.
IMPRESSION: 1. No acute intracranial pathology.
2. Layering fluid in the maxillary sinuses which can be seen with
acute sinusitis in the correct clinical setting.

## 2022-06-13 ENCOUNTER — Other Ambulatory Visit (HOSPITAL_COMMUNITY): Payer: Self-pay

## 2022-06-13 MED ORDER — TAMSULOSIN HCL 0.4 MG PO CAPS
0.4000 mg | ORAL_CAPSULE | Freq: Every day | ORAL | 3 refills | Status: DC
  Filled 2022-06-13: qty 90, 90d supply, fill #0
  Filled 2022-09-09: qty 90, 90d supply, fill #1
  Filled 2022-12-08: qty 90, 90d supply, fill #2
  Filled 2023-03-09: qty 90, 90d supply, fill #3

## 2022-06-23 ENCOUNTER — Encounter (INDEPENDENT_AMBULATORY_CARE_PROVIDER_SITE_OTHER): Payer: PPO | Admitting: Ophthalmology

## 2022-06-23 DIAGNOSIS — H43813 Vitreous degeneration, bilateral: Secondary | ICD-10-CM

## 2022-06-23 DIAGNOSIS — E113292 Type 2 diabetes mellitus with mild nonproliferative diabetic retinopathy without macular edema, left eye: Secondary | ICD-10-CM

## 2022-06-23 DIAGNOSIS — E113391 Type 2 diabetes mellitus with moderate nonproliferative diabetic retinopathy without macular edema, right eye: Secondary | ICD-10-CM | POA: Diagnosis not present

## 2022-06-27 ENCOUNTER — Other Ambulatory Visit: Payer: Self-pay

## 2022-06-27 ENCOUNTER — Other Ambulatory Visit (HOSPITAL_COMMUNITY): Payer: Self-pay

## 2022-07-20 DIAGNOSIS — K7460 Unspecified cirrhosis of liver: Secondary | ICD-10-CM | POA: Diagnosis not present

## 2022-07-20 DIAGNOSIS — E785 Hyperlipidemia, unspecified: Secondary | ICD-10-CM | POA: Diagnosis not present

## 2022-07-20 DIAGNOSIS — I6602 Occlusion and stenosis of left middle cerebral artery: Secondary | ICD-10-CM | POA: Diagnosis not present

## 2022-07-20 DIAGNOSIS — E1165 Type 2 diabetes mellitus with hyperglycemia: Secondary | ICD-10-CM | POA: Diagnosis not present

## 2022-07-20 DIAGNOSIS — I651 Occlusion and stenosis of basilar artery: Secondary | ICD-10-CM | POA: Diagnosis not present

## 2022-07-20 DIAGNOSIS — R634 Abnormal weight loss: Secondary | ICD-10-CM | POA: Diagnosis not present

## 2022-07-20 DIAGNOSIS — K513 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: Secondary | ICD-10-CM | POA: Diagnosis not present

## 2022-07-20 DIAGNOSIS — E1140 Type 2 diabetes mellitus with diabetic neuropathy, unspecified: Secondary | ICD-10-CM | POA: Diagnosis not present

## 2022-07-20 DIAGNOSIS — E11311 Type 2 diabetes mellitus with unspecified diabetic retinopathy with macular edema: Secondary | ICD-10-CM | POA: Diagnosis not present

## 2022-07-20 DIAGNOSIS — E162 Hypoglycemia, unspecified: Secondary | ICD-10-CM | POA: Diagnosis not present

## 2022-07-21 ENCOUNTER — Other Ambulatory Visit (HOSPITAL_COMMUNITY): Payer: Self-pay

## 2022-07-21 DIAGNOSIS — D509 Iron deficiency anemia, unspecified: Secondary | ICD-10-CM | POA: Diagnosis not present

## 2022-07-21 DIAGNOSIS — N400 Enlarged prostate without lower urinary tract symptoms: Secondary | ICD-10-CM | POA: Diagnosis not present

## 2022-07-21 DIAGNOSIS — K513 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: Secondary | ICD-10-CM | POA: Diagnosis not present

## 2022-07-21 DIAGNOSIS — D84821 Immunodeficiency due to drugs: Secondary | ICD-10-CM | POA: Diagnosis not present

## 2022-07-21 DIAGNOSIS — E1169 Type 2 diabetes mellitus with other specified complication: Secondary | ICD-10-CM | POA: Diagnosis not present

## 2022-07-21 DIAGNOSIS — E1142 Type 2 diabetes mellitus with diabetic polyneuropathy: Secondary | ICD-10-CM | POA: Diagnosis not present

## 2022-07-21 DIAGNOSIS — F331 Major depressive disorder, recurrent, moderate: Secondary | ICD-10-CM | POA: Diagnosis not present

## 2022-07-21 DIAGNOSIS — G8929 Other chronic pain: Secondary | ICD-10-CM | POA: Diagnosis not present

## 2022-07-21 DIAGNOSIS — E785 Hyperlipidemia, unspecified: Secondary | ICD-10-CM | POA: Diagnosis not present

## 2022-07-21 DIAGNOSIS — Z89422 Acquired absence of other left toe(s): Secondary | ICD-10-CM | POA: Diagnosis not present

## 2022-07-21 DIAGNOSIS — I10 Essential (primary) hypertension: Secondary | ICD-10-CM | POA: Diagnosis not present

## 2022-07-21 DIAGNOSIS — Z794 Long term (current) use of insulin: Secondary | ICD-10-CM | POA: Diagnosis not present

## 2022-07-22 ENCOUNTER — Other Ambulatory Visit: Payer: Self-pay

## 2022-07-22 ENCOUNTER — Other Ambulatory Visit (HOSPITAL_COMMUNITY): Payer: Self-pay

## 2022-07-22 MED ORDER — OLMESARTAN MEDOXOMIL 5 MG PO TABS
10.0000 mg | ORAL_TABLET | Freq: Every day | ORAL | 2 refills | Status: DC
  Filled 2022-07-22: qty 180, 90d supply, fill #0
  Filled 2022-10-31: qty 180, 90d supply, fill #1
  Filled 2023-02-01: qty 180, 90d supply, fill #2

## 2022-08-11 ENCOUNTER — Other Ambulatory Visit (HOSPITAL_COMMUNITY): Payer: Self-pay

## 2022-08-11 ENCOUNTER — Other Ambulatory Visit: Payer: Self-pay

## 2022-08-11 MED ORDER — METFORMIN HCL 1000 MG PO TABS
1000.0000 mg | ORAL_TABLET | Freq: Two times a day (BID) | ORAL | 0 refills | Status: DC
  Filled 2022-08-11: qty 60, 30d supply, fill #0

## 2022-08-12 ENCOUNTER — Other Ambulatory Visit (HOSPITAL_COMMUNITY): Payer: Self-pay

## 2022-08-12 ENCOUNTER — Other Ambulatory Visit: Payer: Self-pay

## 2022-08-22 ENCOUNTER — Other Ambulatory Visit (HOSPITAL_COMMUNITY): Payer: Self-pay

## 2022-08-23 ENCOUNTER — Other Ambulatory Visit (HOSPITAL_COMMUNITY): Payer: Self-pay

## 2022-08-23 ENCOUNTER — Other Ambulatory Visit: Payer: Self-pay

## 2022-08-23 MED ORDER — NOVOLIN 70/30 FLEXPEN (70-30) 100 UNIT/ML ~~LOC~~ SUPN
10.0000 [IU] | PEN_INJECTOR | Freq: Two times a day (BID) | SUBCUTANEOUS | 5 refills | Status: DC
  Filled 2022-08-23: qty 15, 75d supply, fill #0
  Filled 2022-11-07: qty 15, 75d supply, fill #1
  Filled 2023-02-01: qty 9, 45d supply, fill #2
  Filled 2023-05-05: qty 9, 45d supply, fill #3

## 2022-09-09 ENCOUNTER — Other Ambulatory Visit (HOSPITAL_COMMUNITY): Payer: Self-pay

## 2022-09-09 MED ORDER — METFORMIN HCL 1000 MG PO TABS
1000.0000 mg | ORAL_TABLET | Freq: Two times a day (BID) | ORAL | 3 refills | Status: DC
  Filled 2022-09-09: qty 180, 90d supply, fill #0
  Filled 2022-12-08: qty 180, 90d supply, fill #1
  Filled 2023-03-09: qty 180, 90d supply, fill #2

## 2022-09-23 ENCOUNTER — Other Ambulatory Visit (HOSPITAL_COMMUNITY): Payer: Self-pay

## 2022-10-12 DIAGNOSIS — I959 Hypotension, unspecified: Secondary | ICD-10-CM | POA: Diagnosis not present

## 2022-10-31 ENCOUNTER — Other Ambulatory Visit: Payer: Self-pay

## 2022-10-31 ENCOUNTER — Other Ambulatory Visit (HOSPITAL_COMMUNITY): Payer: Self-pay

## 2022-11-01 ENCOUNTER — Other Ambulatory Visit: Payer: Self-pay

## 2022-11-07 ENCOUNTER — Other Ambulatory Visit: Payer: Self-pay

## 2022-11-07 ENCOUNTER — Other Ambulatory Visit (HOSPITAL_COMMUNITY): Payer: Self-pay

## 2022-12-08 ENCOUNTER — Other Ambulatory Visit (HOSPITAL_COMMUNITY): Payer: Self-pay

## 2022-12-08 ENCOUNTER — Other Ambulatory Visit: Payer: Self-pay

## 2022-12-09 ENCOUNTER — Other Ambulatory Visit (HOSPITAL_COMMUNITY): Payer: Self-pay

## 2022-12-09 ENCOUNTER — Other Ambulatory Visit: Payer: Self-pay

## 2022-12-12 ENCOUNTER — Other Ambulatory Visit: Payer: Self-pay

## 2022-12-12 ENCOUNTER — Other Ambulatory Visit (HOSPITAL_COMMUNITY): Payer: Self-pay

## 2023-01-06 ENCOUNTER — Other Ambulatory Visit (HOSPITAL_BASED_OUTPATIENT_CLINIC_OR_DEPARTMENT_OTHER): Payer: Self-pay

## 2023-01-06 ENCOUNTER — Other Ambulatory Visit (HOSPITAL_COMMUNITY): Payer: Self-pay

## 2023-01-06 ENCOUNTER — Other Ambulatory Visit: Payer: Self-pay

## 2023-01-06 DIAGNOSIS — R42 Dizziness and giddiness: Secondary | ICD-10-CM | POA: Diagnosis not present

## 2023-01-06 DIAGNOSIS — F330 Major depressive disorder, recurrent, mild: Secondary | ICD-10-CM | POA: Diagnosis not present

## 2023-01-06 DIAGNOSIS — Z794 Long term (current) use of insulin: Secondary | ICD-10-CM | POA: Diagnosis not present

## 2023-01-06 DIAGNOSIS — Z23 Encounter for immunization: Secondary | ICD-10-CM | POA: Diagnosis not present

## 2023-01-06 DIAGNOSIS — R5383 Other fatigue: Secondary | ICD-10-CM | POA: Diagnosis not present

## 2023-01-06 DIAGNOSIS — T148XXA Other injury of unspecified body region, initial encounter: Secondary | ICD-10-CM | POA: Diagnosis not present

## 2023-01-06 DIAGNOSIS — H6993 Unspecified Eustachian tube disorder, bilateral: Secondary | ICD-10-CM | POA: Diagnosis not present

## 2023-01-06 DIAGNOSIS — E559 Vitamin D deficiency, unspecified: Secondary | ICD-10-CM | POA: Diagnosis not present

## 2023-01-06 DIAGNOSIS — E119 Type 2 diabetes mellitus without complications: Secondary | ICD-10-CM | POA: Diagnosis not present

## 2023-01-06 DIAGNOSIS — R319 Hematuria, unspecified: Secondary | ICD-10-CM | POA: Diagnosis not present

## 2023-01-06 MED ORDER — FLUTICASONE PROPIONATE 50 MCG/ACT NA SUSP
1.0000 | Freq: Every day | NASAL | 0 refills | Status: DC
  Filled 2023-01-06: qty 16, 60d supply, fill #0
  Filled 2023-01-06: qty 16, 30d supply, fill #0

## 2023-01-10 DIAGNOSIS — E538 Deficiency of other specified B group vitamins: Secondary | ICD-10-CM | POA: Diagnosis not present

## 2023-01-24 DIAGNOSIS — E538 Deficiency of other specified B group vitamins: Secondary | ICD-10-CM | POA: Diagnosis not present

## 2023-02-01 ENCOUNTER — Other Ambulatory Visit (HOSPITAL_COMMUNITY): Payer: Self-pay

## 2023-02-01 ENCOUNTER — Other Ambulatory Visit: Payer: Self-pay

## 2023-02-03 ENCOUNTER — Other Ambulatory Visit: Payer: Self-pay

## 2023-02-06 ENCOUNTER — Other Ambulatory Visit (HOSPITAL_COMMUNITY): Payer: Self-pay

## 2023-02-07 DIAGNOSIS — E538 Deficiency of other specified B group vitamins: Secondary | ICD-10-CM | POA: Diagnosis not present

## 2023-02-08 DIAGNOSIS — H02409 Unspecified ptosis of unspecified eyelid: Secondary | ICD-10-CM | POA: Insufficient documentation

## 2023-02-09 DIAGNOSIS — E7800 Pure hypercholesterolemia, unspecified: Secondary | ICD-10-CM | POA: Insufficient documentation

## 2023-02-09 DIAGNOSIS — H0279 Other degenerative disorders of eyelid and periocular area: Secondary | ICD-10-CM | POA: Diagnosis not present

## 2023-02-09 DIAGNOSIS — I10 Essential (primary) hypertension: Secondary | ICD-10-CM | POA: Insufficient documentation

## 2023-02-09 DIAGNOSIS — Z01818 Encounter for other preprocedural examination: Secondary | ICD-10-CM | POA: Diagnosis not present

## 2023-02-09 DIAGNOSIS — H02422 Myogenic ptosis of left eyelid: Secondary | ICD-10-CM | POA: Diagnosis not present

## 2023-02-09 DIAGNOSIS — H02421 Myogenic ptosis of right eyelid: Secondary | ICD-10-CM | POA: Diagnosis not present

## 2023-02-09 DIAGNOSIS — H02423 Myogenic ptosis of bilateral eyelids: Secondary | ICD-10-CM | POA: Diagnosis not present

## 2023-02-09 DIAGNOSIS — H53483 Generalized contraction of visual field, bilateral: Secondary | ICD-10-CM | POA: Diagnosis not present

## 2023-02-09 DIAGNOSIS — E119 Type 2 diabetes mellitus without complications: Secondary | ICD-10-CM | POA: Insufficient documentation

## 2023-02-09 DIAGNOSIS — H57813 Brow ptosis, bilateral: Secondary | ICD-10-CM | POA: Diagnosis not present

## 2023-02-13 DIAGNOSIS — E1165 Type 2 diabetes mellitus with hyperglycemia: Secondary | ICD-10-CM | POA: Diagnosis not present

## 2023-02-13 DIAGNOSIS — E538 Deficiency of other specified B group vitamins: Secondary | ICD-10-CM | POA: Diagnosis not present

## 2023-02-13 DIAGNOSIS — E162 Hypoglycemia, unspecified: Secondary | ICD-10-CM | POA: Diagnosis not present

## 2023-02-13 DIAGNOSIS — D649 Anemia, unspecified: Secondary | ICD-10-CM | POA: Diagnosis not present

## 2023-02-13 DIAGNOSIS — E11319 Type 2 diabetes mellitus with unspecified diabetic retinopathy without macular edema: Secondary | ICD-10-CM | POA: Diagnosis not present

## 2023-02-13 DIAGNOSIS — E785 Hyperlipidemia, unspecified: Secondary | ICD-10-CM | POA: Diagnosis not present

## 2023-02-13 DIAGNOSIS — E559 Vitamin D deficiency, unspecified: Secondary | ICD-10-CM | POA: Diagnosis not present

## 2023-02-14 ENCOUNTER — Other Ambulatory Visit (HOSPITAL_BASED_OUTPATIENT_CLINIC_OR_DEPARTMENT_OTHER): Payer: Self-pay

## 2023-02-14 ENCOUNTER — Other Ambulatory Visit: Payer: Self-pay

## 2023-02-14 MED ORDER — NOVOLIN 70/30 FLEXPEN (70-30) 100 UNIT/ML ~~LOC~~ SUPN
10.0000 [IU] | PEN_INJECTOR | Freq: Two times a day (BID) | SUBCUTANEOUS | 5 refills | Status: DC
  Filled 2023-02-14 – 2023-03-09 (×3): qty 15, 75d supply, fill #0
  Filled 2023-05-05: qty 15, 75d supply, fill #1
  Filled 2023-07-03: qty 15, 75d supply, fill #2
  Filled 2023-08-31: qty 15, 75d supply, fill #3
  Filled 2023-10-19 – 2023-10-23 (×2): qty 15, 75d supply, fill #4
  Filled 2023-10-27: qty 15, 75d supply, fill #0
  Filled 2023-10-27: qty 15, 75d supply, fill #4
  Filled 2023-12-15 – 2023-12-18 (×2): qty 15, 75d supply, fill #1

## 2023-02-14 MED ORDER — ROSUVASTATIN CALCIUM 10 MG PO TABS
10.0000 mg | ORAL_TABLET | Freq: Every day | ORAL | 3 refills | Status: DC
  Filled 2023-02-14 – 2023-02-16 (×2): qty 90, 90d supply, fill #0
  Filled 2023-05-25: qty 90, 90d supply, fill #1
  Filled 2023-08-22: qty 90, 90d supply, fill #2

## 2023-02-16 ENCOUNTER — Other Ambulatory Visit: Payer: Self-pay

## 2023-02-16 ENCOUNTER — Other Ambulatory Visit (HOSPITAL_BASED_OUTPATIENT_CLINIC_OR_DEPARTMENT_OTHER): Payer: Self-pay

## 2023-02-16 ENCOUNTER — Other Ambulatory Visit (HOSPITAL_COMMUNITY): Payer: Self-pay

## 2023-02-21 DIAGNOSIS — E538 Deficiency of other specified B group vitamins: Secondary | ICD-10-CM | POA: Diagnosis not present

## 2023-03-09 ENCOUNTER — Other Ambulatory Visit (HOSPITAL_COMMUNITY): Payer: Self-pay

## 2023-03-09 ENCOUNTER — Other Ambulatory Visit: Payer: Self-pay

## 2023-03-13 ENCOUNTER — Other Ambulatory Visit: Payer: Self-pay

## 2023-03-24 DIAGNOSIS — E538 Deficiency of other specified B group vitamins: Secondary | ICD-10-CM | POA: Diagnosis not present

## 2023-04-07 ENCOUNTER — Other Ambulatory Visit (HOSPITAL_BASED_OUTPATIENT_CLINIC_OR_DEPARTMENT_OTHER): Payer: Self-pay

## 2023-04-07 DIAGNOSIS — Z0000 Encounter for general adult medical examination without abnormal findings: Secondary | ICD-10-CM | POA: Diagnosis not present

## 2023-04-07 DIAGNOSIS — E559 Vitamin D deficiency, unspecified: Secondary | ICD-10-CM | POA: Diagnosis not present

## 2023-04-07 DIAGNOSIS — K7460 Unspecified cirrhosis of liver: Secondary | ICD-10-CM | POA: Diagnosis not present

## 2023-04-07 DIAGNOSIS — I959 Hypotension, unspecified: Secondary | ICD-10-CM | POA: Diagnosis not present

## 2023-04-07 DIAGNOSIS — Z23 Encounter for immunization: Secondary | ICD-10-CM | POA: Diagnosis not present

## 2023-04-07 DIAGNOSIS — D649 Anemia, unspecified: Secondary | ICD-10-CM | POA: Diagnosis not present

## 2023-04-07 DIAGNOSIS — N401 Enlarged prostate with lower urinary tract symptoms: Secondary | ICD-10-CM | POA: Diagnosis not present

## 2023-04-07 DIAGNOSIS — D84821 Immunodeficiency due to drugs: Secondary | ICD-10-CM | POA: Diagnosis not present

## 2023-04-07 DIAGNOSIS — Z89422 Acquired absence of other left toe(s): Secondary | ICD-10-CM | POA: Diagnosis not present

## 2023-04-07 DIAGNOSIS — E1165 Type 2 diabetes mellitus with hyperglycemia: Secondary | ICD-10-CM | POA: Diagnosis not present

## 2023-04-07 DIAGNOSIS — E1142 Type 2 diabetes mellitus with diabetic polyneuropathy: Secondary | ICD-10-CM | POA: Diagnosis not present

## 2023-04-07 MED ORDER — GABAPENTIN 100 MG PO CAPS
100.0000 mg | ORAL_CAPSULE | Freq: Every day | ORAL | 0 refills | Status: DC
  Filled 2023-04-07 – 2023-04-10 (×2): qty 30, 30d supply, fill #0

## 2023-04-10 ENCOUNTER — Other Ambulatory Visit: Payer: Self-pay

## 2023-04-10 ENCOUNTER — Other Ambulatory Visit (HOSPITAL_COMMUNITY): Payer: Self-pay

## 2023-04-10 ENCOUNTER — Other Ambulatory Visit (HOSPITAL_BASED_OUTPATIENT_CLINIC_OR_DEPARTMENT_OTHER): Payer: Self-pay

## 2023-04-10 ENCOUNTER — Other Ambulatory Visit: Payer: Self-pay | Admitting: Family Medicine

## 2023-04-10 DIAGNOSIS — K7460 Unspecified cirrhosis of liver: Secondary | ICD-10-CM

## 2023-04-11 ENCOUNTER — Ambulatory Visit
Admission: RE | Admit: 2023-04-11 | Discharge: 2023-04-11 | Disposition: A | Discharge status: Home / Self Care | Payer: PPO | Source: Ambulatory Visit | Attending: Family Medicine | Admitting: Family Medicine

## 2023-04-11 DIAGNOSIS — K7460 Unspecified cirrhosis of liver: Secondary | ICD-10-CM | POA: Diagnosis not present

## 2023-04-17 ENCOUNTER — Other Ambulatory Visit (HOSPITAL_BASED_OUTPATIENT_CLINIC_OR_DEPARTMENT_OTHER): Payer: Self-pay

## 2023-04-19 DIAGNOSIS — R972 Elevated prostate specific antigen [PSA]: Secondary | ICD-10-CM | POA: Diagnosis not present

## 2023-05-05 ENCOUNTER — Other Ambulatory Visit (HOSPITAL_COMMUNITY): Payer: Self-pay

## 2023-05-05 ENCOUNTER — Other Ambulatory Visit: Payer: Self-pay

## 2023-05-08 ENCOUNTER — Other Ambulatory Visit: Payer: Self-pay

## 2023-05-08 ENCOUNTER — Other Ambulatory Visit (HOSPITAL_COMMUNITY): Payer: Self-pay

## 2023-05-08 MED ORDER — OLMESARTAN MEDOXOMIL 5 MG PO TABS
5.0000 mg | ORAL_TABLET | Freq: Every day | ORAL | 3 refills | Status: DC
  Filled 2023-05-08: qty 100, 50d supply, fill #0
  Filled 2023-08-31: qty 100, 50d supply, fill #1

## 2023-05-08 MED ORDER — FLUTICASONE PROPIONATE 50 MCG/ACT NA SUSP
1.0000 | Freq: Every day | NASAL | 0 refills | Status: DC
  Filled 2023-05-08: qty 16, 60d supply, fill #0

## 2023-05-18 ENCOUNTER — Other Ambulatory Visit (HOSPITAL_BASED_OUTPATIENT_CLINIC_OR_DEPARTMENT_OTHER): Payer: Self-pay

## 2023-05-18 DIAGNOSIS — R310 Gross hematuria: Secondary | ICD-10-CM | POA: Diagnosis not present

## 2023-05-18 MED ORDER — TAMSULOSIN HCL 0.4 MG PO CAPS
0.4000 mg | ORAL_CAPSULE | Freq: Every day | ORAL | 3 refills | Status: DC
  Filled 2023-05-18: qty 30, 30d supply, fill #0
  Filled 2023-05-19: qty 90, 90d supply, fill #0

## 2023-05-19 ENCOUNTER — Other Ambulatory Visit (HOSPITAL_BASED_OUTPATIENT_CLINIC_OR_DEPARTMENT_OTHER): Payer: Self-pay

## 2023-05-19 ENCOUNTER — Other Ambulatory Visit (HOSPITAL_COMMUNITY): Payer: Self-pay

## 2023-05-24 DIAGNOSIS — K4020 Bilateral inguinal hernia, without obstruction or gangrene, not specified as recurrent: Secondary | ICD-10-CM | POA: Diagnosis not present

## 2023-05-24 DIAGNOSIS — R310 Gross hematuria: Secondary | ICD-10-CM | POA: Diagnosis not present

## 2023-05-24 DIAGNOSIS — N401 Enlarged prostate with lower urinary tract symptoms: Secondary | ICD-10-CM | POA: Diagnosis not present

## 2023-05-24 DIAGNOSIS — N402 Nodular prostate without lower urinary tract symptoms: Secondary | ICD-10-CM | POA: Diagnosis not present

## 2023-05-25 ENCOUNTER — Other Ambulatory Visit (HOSPITAL_COMMUNITY): Payer: Self-pay

## 2023-06-03 ENCOUNTER — Other Ambulatory Visit (HOSPITAL_COMMUNITY): Payer: Self-pay

## 2023-06-05 ENCOUNTER — Other Ambulatory Visit: Payer: Self-pay

## 2023-06-05 ENCOUNTER — Other Ambulatory Visit (HOSPITAL_COMMUNITY): Payer: Self-pay

## 2023-06-05 MED ORDER — FENOFIBRATE 145 MG PO TABS
145.0000 mg | ORAL_TABLET | Freq: Every day | ORAL | 3 refills | Status: DC
  Filled 2023-06-05: qty 90, 90d supply, fill #0
  Filled 2023-08-22: qty 90, 90d supply, fill #1
  Filled 2023-11-27: qty 90, 90d supply, fill #2
  Filled 2024-02-26 – 2024-02-27 (×2): qty 90, 90d supply, fill #3

## 2023-06-13 DIAGNOSIS — E1165 Type 2 diabetes mellitus with hyperglycemia: Secondary | ICD-10-CM | POA: Diagnosis not present

## 2023-06-13 DIAGNOSIS — K7460 Unspecified cirrhosis of liver: Secondary | ICD-10-CM | POA: Diagnosis not present

## 2023-06-16 ENCOUNTER — Other Ambulatory Visit (HOSPITAL_COMMUNITY): Payer: Self-pay

## 2023-06-16 ENCOUNTER — Other Ambulatory Visit (HOSPITAL_BASED_OUTPATIENT_CLINIC_OR_DEPARTMENT_OTHER): Payer: Self-pay

## 2023-06-16 ENCOUNTER — Other Ambulatory Visit: Payer: Self-pay

## 2023-06-16 MED ORDER — TAMSULOSIN HCL 0.4 MG PO CAPS
0.8000 mg | ORAL_CAPSULE | Freq: Every day | ORAL | 3 refills | Status: DC
  Filled 2023-06-16 – 2023-08-22 (×2): qty 180, 90d supply, fill #0

## 2023-06-16 MED ORDER — FINASTERIDE 5 MG PO TABS
5.0000 mg | ORAL_TABLET | Freq: Every day | ORAL | 3 refills | Status: DC
  Filled 2023-06-16 (×2): qty 90, 90d supply, fill #0

## 2023-06-17 ENCOUNTER — Other Ambulatory Visit (HOSPITAL_BASED_OUTPATIENT_CLINIC_OR_DEPARTMENT_OTHER): Payer: Self-pay

## 2023-06-19 DIAGNOSIS — K5120 Ulcerative (chronic) proctitis without complications: Secondary | ICD-10-CM | POA: Diagnosis not present

## 2023-07-03 ENCOUNTER — Other Ambulatory Visit (HOSPITAL_COMMUNITY): Payer: Self-pay

## 2023-07-03 ENCOUNTER — Other Ambulatory Visit: Payer: Self-pay

## 2023-07-25 ENCOUNTER — Other Ambulatory Visit (HOSPITAL_COMMUNITY): Payer: Self-pay

## 2023-08-22 ENCOUNTER — Other Ambulatory Visit (HOSPITAL_COMMUNITY): Payer: Self-pay

## 2023-08-22 ENCOUNTER — Other Ambulatory Visit: Payer: Self-pay

## 2023-08-31 ENCOUNTER — Other Ambulatory Visit (HOSPITAL_COMMUNITY): Payer: Self-pay

## 2023-08-31 ENCOUNTER — Other Ambulatory Visit: Payer: Self-pay

## 2023-09-12 DIAGNOSIS — E559 Vitamin D deficiency, unspecified: Secondary | ICD-10-CM | POA: Diagnosis not present

## 2023-09-12 DIAGNOSIS — I651 Occlusion and stenosis of basilar artery: Secondary | ICD-10-CM | POA: Diagnosis not present

## 2023-09-12 DIAGNOSIS — I708 Atherosclerosis of other arteries: Secondary | ICD-10-CM | POA: Diagnosis not present

## 2023-09-12 DIAGNOSIS — K7460 Unspecified cirrhosis of liver: Secondary | ICD-10-CM | POA: Diagnosis not present

## 2023-09-12 DIAGNOSIS — I959 Hypotension, unspecified: Secondary | ICD-10-CM | POA: Diagnosis not present

## 2023-09-12 DIAGNOSIS — E1165 Type 2 diabetes mellitus with hyperglycemia: Secondary | ICD-10-CM | POA: Diagnosis not present

## 2023-09-12 DIAGNOSIS — E785 Hyperlipidemia, unspecified: Secondary | ICD-10-CM | POA: Diagnosis not present

## 2023-09-28 DIAGNOSIS — F329 Major depressive disorder, single episode, unspecified: Secondary | ICD-10-CM | POA: Insufficient documentation

## 2023-09-28 DIAGNOSIS — E559 Vitamin D deficiency, unspecified: Secondary | ICD-10-CM | POA: Insufficient documentation

## 2023-09-28 DIAGNOSIS — R5383 Other fatigue: Secondary | ICD-10-CM | POA: Insufficient documentation

## 2023-09-28 DIAGNOSIS — K648 Other hemorrhoids: Secondary | ICD-10-CM | POA: Insufficient documentation

## 2023-09-28 DIAGNOSIS — I10 Essential (primary) hypertension: Secondary | ICD-10-CM | POA: Insufficient documentation

## 2023-09-28 DIAGNOSIS — E1165 Type 2 diabetes mellitus with hyperglycemia: Secondary | ICD-10-CM | POA: Insufficient documentation

## 2023-09-28 DIAGNOSIS — E11319 Type 2 diabetes mellitus with unspecified diabetic retinopathy without macular edema: Secondary | ICD-10-CM | POA: Insufficient documentation

## 2023-09-29 DIAGNOSIS — H02403 Unspecified ptosis of bilateral eyelids: Secondary | ICD-10-CM | POA: Diagnosis not present

## 2023-10-02 ENCOUNTER — Ambulatory Visit: Payer: PPO | Admitting: Neurology

## 2023-10-02 ENCOUNTER — Encounter: Payer: Self-pay | Admitting: Neurology

## 2023-10-02 VITALS — BP 124/78 | HR 76 | Ht 66.0 in | Wt 151.0 lb

## 2023-10-02 DIAGNOSIS — R202 Paresthesia of skin: Secondary | ICD-10-CM | POA: Diagnosis not present

## 2023-10-02 DIAGNOSIS — I6502 Occlusion and stenosis of left vertebral artery: Secondary | ICD-10-CM | POA: Insufficient documentation

## 2023-10-02 DIAGNOSIS — R269 Unspecified abnormalities of gait and mobility: Secondary | ICD-10-CM | POA: Insufficient documentation

## 2023-10-02 DIAGNOSIS — H02403 Unspecified ptosis of bilateral eyelids: Secondary | ICD-10-CM | POA: Diagnosis not present
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
## 2023-10-18 DIAGNOSIS — N401 Enlarged prostate with lower urinary tract symptoms: Secondary | ICD-10-CM | POA: Diagnosis not present

## 2023-10-18 DIAGNOSIS — R350 Frequency of micturition: Secondary | ICD-10-CM | POA: Diagnosis not present

## 2023-10-19 ENCOUNTER — Other Ambulatory Visit (HOSPITAL_COMMUNITY): Payer: Self-pay

## 2023-10-23 ENCOUNTER — Other Ambulatory Visit (HOSPITAL_COMMUNITY): Payer: Self-pay

## 2023-10-23 ENCOUNTER — Telehealth: Payer: Self-pay | Admitting: Pharmacist

## 2023-10-23 NOTE — Progress Notes (Addendum)
   10/23/2023  Patient ID: Kirk Rocha, male   DOB: [birth year], 73 y.o.   MRN: [id number]  Patient was identified for DM counseling based on A1c greater than 8% on last lab results.   Tried calling patient today to schedule free DM initial visit me as the pharmacist, with all changes made in collaboration with the PCP. Left HIPAA compliant voicemail requesting call back at earliest convenience to schedule. If patient returns call to office, you can transfer them to my line at [phone]. Thank you!  Attempt #1 (out of 3)  Update:  - Patient returned my call- currently on vacation in [place] - Has eye surgery in 2 weeks - Advised I will call back on 11/06/23 to see how eye surgery went and to get [facility] reader info     Kirk Rocha, PharmD [place]  Phone Number: [phone]

## 2023-10-24 ENCOUNTER — Encounter: Payer: Self-pay | Admitting: Ophthalmology

## 2023-10-25 ENCOUNTER — Encounter: Payer: Self-pay | Admitting: Ophthalmology

## 2023-10-27 ENCOUNTER — Other Ambulatory Visit (HOSPITAL_COMMUNITY): Payer: Self-pay

## 2023-10-27 ENCOUNTER — Other Ambulatory Visit: Payer: Self-pay

## 2023-10-30 ENCOUNTER — Other Ambulatory Visit (HOSPITAL_COMMUNITY): Payer: Self-pay

## 2023-10-30 ENCOUNTER — Other Ambulatory Visit: Payer: Self-pay

## 2023-10-30 MED ORDER — METFORMIN HCL 1000 MG PO TABS
1000.0000 mg | ORAL_TABLET | Freq: Every day | ORAL | 3 refills | Status: AC
  Filled 2023-10-30: qty 90, 90d supply, fill #0

## 2023-10-31 NOTE — Discharge Instructions (Signed)

## 2023-11-03 ENCOUNTER — Other Ambulatory Visit: Payer: Self-pay

## 2023-11-03 ENCOUNTER — Ambulatory Visit: Payer: Self-pay | Admitting: Anesthesiology

## 2023-11-03 ENCOUNTER — Ambulatory Visit
Admission: RE | Admit: 2023-11-03 | Discharge: 2023-11-03 | Disposition: A | Discharge status: Home / Self Care | Attending: Ophthalmology | Admitting: Ophthalmology

## 2023-11-03 ENCOUNTER — Encounter: Payer: Self-pay | Admitting: Ophthalmology

## 2023-11-03 ENCOUNTER — Encounter
Admission: RE | Disposition: A | Discharge status: Home / Self Care | Payer: Self-pay | Source: Home / Self Care | Attending: Ophthalmology

## 2023-11-03 DIAGNOSIS — E11319 Type 2 diabetes mellitus with unspecified diabetic retinopathy without macular edema: Secondary | ICD-10-CM | POA: Diagnosis not present

## 2023-11-03 DIAGNOSIS — I10 Essential (primary) hypertension: Secondary | ICD-10-CM | POA: Diagnosis not present

## 2023-11-03 DIAGNOSIS — Z79899 Other long term (current) drug therapy: Secondary | ICD-10-CM | POA: Insufficient documentation

## 2023-11-03 DIAGNOSIS — Z87891 Personal history of nicotine dependence: Secondary | ICD-10-CM | POA: Diagnosis not present

## 2023-11-03 DIAGNOSIS — Z7984 Long term (current) use of oral hypoglycemic drugs: Secondary | ICD-10-CM | POA: Insufficient documentation

## 2023-11-03 DIAGNOSIS — Z794 Long term (current) use of insulin: Secondary | ICD-10-CM | POA: Insufficient documentation

## 2023-11-03 DIAGNOSIS — Z7982 Long term (current) use of aspirin: Secondary | ICD-10-CM | POA: Diagnosis not present

## 2023-11-03 DIAGNOSIS — E1142 Type 2 diabetes mellitus with diabetic polyneuropathy: Secondary | ICD-10-CM | POA: Insufficient documentation

## 2023-11-03 DIAGNOSIS — Z79622 Long term (current) use of janus kinase inhibitor: Secondary | ICD-10-CM | POA: Diagnosis not present

## 2023-11-03 DIAGNOSIS — H02403 Unspecified ptosis of bilateral eyelids: Secondary | ICD-10-CM | POA: Insufficient documentation

## 2023-11-03 HISTORY — DX: Occlusion and stenosis of left vertebral artery: I65.02

## 2023-11-03 HISTORY — DX: Occlusion and stenosis of basilar artery: I65.1

## 2023-11-03 HISTORY — DX: Unspecified osteoarthritis, unspecified site: M19.90

## 2023-11-03 HISTORY — DX: Paresthesia of skin: R20.2

## 2023-11-03 HISTORY — PX: PTOSIS REPAIR: SHX6568

## 2023-11-03 HISTORY — DX: Hepatic fibrosis, unspecified: K74.00

## 2023-11-03 HISTORY — DX: Chronic viral hepatitis C: B18.2

## 2023-11-03 HISTORY — DX: Unspecified abnormalities of gait and mobility: R26.9

## 2023-11-03 HISTORY — DX: Personal history of other infectious and parasitic diseases: Z86.19

## 2023-11-03 HISTORY — DX: Depression, unspecified: F32.A

## 2023-11-03 HISTORY — DX: Type 2 diabetes mellitus with unspecified diabetic retinopathy without macular edema: E11.319

## 2023-11-03 HISTORY — DX: Iron deficiency anemia, unspecified: D50.9

## 2023-11-03 HISTORY — DX: Type 2 diabetes mellitus with diabetic polyneuropathy: E11.42

## 2023-11-03 HISTORY — DX: 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: K51.30

## 2023-11-03 HISTORY — DX: Dizziness and giddiness: R42

## 2023-11-03 LAB — GLUCOSE, CAPILLARY: Glucose-Capillary: 111 mg/dL — ABNORMAL HIGH (ref 70–99)

## 2023-11-03 SURGERY — REPAIR, BLEPHAROPTOSIS
Anesthesia: Monitor Anesthesia Care | Laterality: Bilateral

## 2023-11-03 MED ORDER — FENTANYL CITRATE PF 50 MCG/ML IJ SOSY: 25.0000 ug | PREFILLED_SYRINGE | INTRAMUSCULAR | Status: DC | PRN

## 2023-11-03 MED ORDER — ERYTHROMYCIN 5 MG/GM OP OINT: TOPICAL_OINTMENT | OPHTHALMIC | 2 refills | Status: AC

## 2023-11-03 MED ORDER — TETRACAINE HCL 0.5 % OP SOLN
OPHTHALMIC | Status: DC | PRN
  Administered 2023-11-03: 2 [drp] via OPHTHALMIC

## 2023-11-03 MED ORDER — SODIUM CHLORIDE 0.9 % IV SOLN: INTRAVENOUS | Status: DC | PRN

## 2023-11-03 MED ORDER — TRAMADOL HCL 50 MG PO TABS: ORAL_TABLET | ORAL | 0 refills | Status: DC

## 2023-11-03 MED ORDER — BSS IO SOLN
INTRAOCULAR | Status: DC | PRN
Start: 2023-11-03 — End: 2023-11-03
  Administered 2023-11-03: 15 mL via INTRAOCULAR

## 2023-11-03 MED ORDER — FENTANYL CITRATE (PF) 100 MCG/2ML IJ SOLN
INTRAMUSCULAR | Status: AC
  Filled 2023-11-03: qty 2

## 2023-11-03 MED ORDER — LIDOCAINE-EPINEPHRINE 2 %-1:100000 IJ SOLN
INTRAMUSCULAR | Status: DC | PRN
  Administered 2023-11-03: 1 mL via OPHTHALMIC
  Administered 2023-11-03: .5 mL via OPHTHALMIC

## 2023-11-03 MED ORDER — LIDOCAINE HCL (CARDIAC) PF 100 MG/5ML IV SOSY
PREFILLED_SYRINGE | INTRAVENOUS | Status: DC | PRN
  Administered 2023-11-03: 40 mg via INTRAVENOUS

## 2023-11-03 MED ORDER — DEXMEDETOMIDINE HCL IN NACL 200 MCG/50ML IV SOLN
INTRAVENOUS | Status: DC | PRN
  Administered 2023-11-03: 4 ug via INTRAVENOUS

## 2023-11-03 MED ORDER — LACTATED RINGERS IV SOLN: INTRAVENOUS | Status: DC

## 2023-11-03 MED ORDER — OXYCODONE HCL 5 MG PO TABS: 5.0000 mg | ORAL_TABLET | Freq: Once | ORAL | Status: DC | PRN

## 2023-11-03 MED ORDER — OXYCODONE HCL 5 MG/5ML PO SOLN: 5.0000 mg | Freq: Once | ORAL | Status: DC | PRN

## 2023-11-03 MED ORDER — PROPOFOL 500 MG/50ML IV EMUL
INTRAVENOUS | Status: DC | PRN
  Administered 2023-11-03: 40 mg via INTRAVENOUS

## 2023-11-03 MED ORDER — ERYTHROMYCIN 5 MG/GM OP OINT
TOPICAL_OINTMENT | OPHTHALMIC | Status: DC | PRN
  Administered 2023-11-03: 1 via OPHTHALMIC

## 2023-11-03 SURGICAL SUPPLY — 19 items
APPLICATOR COTTON TIP 3IN (MISCELLANEOUS) ×2 IMPLANT
BLADE SURG 15 STRL LF DISP TIS (BLADE) ×6 IMPLANT
CORD BIP STRL DISP 12FT (MISCELLANEOUS) ×2 IMPLANT
GAUZE SPONGE 2X2 STRL 8-PLY (GAUZE/BANDAGES/DRESSINGS) ×20 IMPLANT
GAUZE SPONGE 4X4 12PLY STRL (GAUZE/BANDAGES/DRESSINGS) ×4 IMPLANT
GLOVE SURG UNDER POLY LF SZ7 (GLOVE) ×4 IMPLANT
GOWN STRL REUS W/ TWL LRG LVL3 (GOWN DISPOSABLE) ×2 IMPLANT
MARKER SKIN XFINE TIP W/RULER (MISCELLANEOUS) ×2 IMPLANT
NDL FILTER BLUNT 18X1 1/2 (NEEDLE) ×2 IMPLANT
NDL HYPO 30X.5 LL (NEEDLE) ×4 IMPLANT
NEEDLE FILTER BLUNT 18X1 1/2 (NEEDLE) ×1 IMPLANT
NEEDLE HYPO 30X.5 LL (NEEDLE) ×2 IMPLANT
PACK ENT CUSTOM (PACKS) ×2 IMPLANT
SOLUTION PREP PVP 2OZ (MISCELLANEOUS) ×2 IMPLANT
SUT GUT PLAIN 6-0 1X18 ABS (SUTURE) ×2 IMPLANT
SUT PROLENE 6 0 P 1 18 (SUTURE) IMPLANT
SYR 10ML LL (SYRINGE) ×2 IMPLANT
SYR 3ML LL SCALE MARK (SYRINGE) ×2 IMPLANT
WATER STERILE IRR 250ML POUR (IV SOLUTION) ×2 IMPLANT

## 2023-11-03 NOTE — Op Note (Signed)
 Preoperative Diagnosis:  Visually significant blepharoptosis bilateral  Upper Eyelid(s)  Postoperative Diagnosis:  Same.  Procedure(s) Performed:   Blepharoptosis repair with levator aponeurosis advancement bilateral  Upper Eyelid(s)  Teaching Surgeon: Vonna Guardian. Althea Atkinson, M.D.  Assistants: none  Anesthesia: MAC  Specimens: None.  Estimated Blood Loss: Minimal.  Complications: None.  Operative Findings: None Dictated  PROCEDURE:  Allergies were reviewed and the patient is allergic to codeine..   After the risks, benefits, complications and alternatives were discussed with the patient, appropriate informed consent was obtained. While seated in an upright position and looking in primary gaze, the mid pupillary line was marked on the upper eyelid margins bilaterally. The patient was then brought to the operating suite and reclined supine.  Timeout was conducted and the patient was sedated. Local anesthetic consisting of a 50-50 mixture of 2% lidocaine with epinephrine and 0.75% bupivacaine with added Hylenex was injected subcutaneously to the bilateral  upper eyelid(s). After adequate local was instilled, the patient was prepped and draped in the usual sterile fashion for eyelid surgery.   Attention was turned to the upper eyelids. A 10mm upper eyelid crease incision line was marked with calipers on both  upper eyelid(s).  Attention was turned to the  right  upper eyelid. A #15 blade was used to open the premarked incision line and hemostasis was obtained with bipolar cautery. [place] scissors were then used to transect through orbicularis for the length of the incision down to the tarsal plate. Epitarsus was dissected to create a smooth surface to suture to. Dissection was then carried superiorly in the plane between orbicularis and orbital septum. Once the preaponeurotic fat pocket was identified, the orbital septum was opened. This revealed the levator and its aponeurosis.    Attention  was then turned to the opposite eyelid where the same procedure was performed in the same manner.   3 interrupted 6-0 Prolene sutures were then passed partial thickness through the tarsal plates of both  upper eyelid(s). These sutures were placed in line with the mid pupillary, medial limbal, and lateral limbal lines. The sutures were fixed to the levator aponeurosis and adjusted until a nice lid height and contour were achieved. Once nice symmetry was achieved, the skin incisions were closed with a running 6-0 fast absorbing plain suture. The patient tolerated the procedure well. Erythromycin ophthalmic ointment was applied to the incision site(s) followed by ice packs. The patient was taken to the recovery area where he recovered without difficulty.  Post-Op Plan/Instructions:  Mr. Fei was instructed to use ice packs frequently for the next 48 hours. His was instructed to use Erythromycin ophthalmic ointment on his incisions 4 times a day for the next 12 to 14 days. He was given a prescription for tramadol (or similar) for pain control should Tylenol not be effective. He was asked to to follow up in 2-3 weeks' time at the [facility] in [place], [place] or sooner as needed for problems.  Cherita Hebel M. Althea Atkinson, M.D. Ophthalmology

## 2023-11-03 NOTE — Interval H&P Note (Signed)
 2History and Physical Interval Note:  11/03/2023 9:42 AM  Kirk Rocha  has presented today for surgery, with the diagnosis of Ptosis.  The various methods of treatment have been discussed with the patient and family. After consideration of risks, benefits and other options for treatment, the patient has consented to  Procedure(s): REPAIR, BLEPHAROPTOSIS (Bilateral) as a surgical intervention.  The patient's history has been reviewed, patient examined, no change in status, stable for surgery.  I have reviewed the patient's chart and labs.  Questions were answered to the patient's satisfaction.     Althea Atkinson, Traniyah Hallett M

## 2023-11-03 NOTE — H&P (Signed)
 [facility]: [facility]  Primary Care Physician:  Olin Bertin, MD Ophthalmologist: Dr. Vonna Guardian. Althea Atkinson, M.D.  Pre-Procedure History & Physical: HPI:  Kirk Rocha is a 73 y.o. male here for periocular surgery.   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 Authorizing Provider  aspirin EC 325 MG tablet Take 325 mg by mouth daily.   Yes [provider]  dapagliflozin  propanediol (FARXIGA ) 5 MG TABS tablet Take 1 tablet (5 mg total) by mouth daily. 03/29/22  Yes   fenofibrate  (TRICOR ) 145 MG tablet Take 1 tablet (145 mg total) by mouth daily. 06/03/23  Yes   insulin  isophane & regular human KwikPen (NOVOLIN  70/30 KWIKPEN) (70-30) 100 UNIT/ML KwikPen Inject 10 Units into the skin 2 (two) times daily before meals. Patient taking differently: Inject 18 Units into the skin 2 (two) times daily. 02/14/23  Yes   metFORMIN  (GLUCOPHAGE ) 1000 MG tablet Take 1 tablet (1,000 mg total) by mouth daily. 10/30/23  Yes   olmesartan  (BENICAR ) 5 MG tablet Take 5 mg by mouth 2 (two) times daily. 01/13/20  Yes [provider]  rosuvastatin  (CRESTOR ) 10 MG tablet Take 10 mg by mouth daily.   Yes [provider]  tamsulosin  (FLOMAX ) 0.4 MG CAPS capsule Take 0.4 mg  by mouth daily. 12/10/19  Yes [provider]  Tofacitinib Citrate (XELJANZ) 10 MG TABS Take 10 mg by mouth 2 (two) times daily.   Yes [provider]  Insulin  Syringe-Needle U-100 (BD INSULIN  SYRINGE U/F) 31G X 5/16 0.3 ML MISC Use to inject insulin  2 times a day 11/29/21   Olin Bertin, MD    Allergies as of 10/05/2023 - Review Complete 10/02/2023  Allergen Reaction Noted   Codeine Other (See Comments) 01/01/2020    History reviewed. No pertinent family history.  Social History   Socioeconomic History   Marital status: Married    Spouse name: Not on file   Number of children: Not on file   Years of education: Not on file   Highest education level: Not on file  Occupational History   Not on file  Tobacco Use   Smoking status: Former    Types: Cigarettes   Smokeless tobacco: Never  Substance and Sexual Activity   Alcohol use: Never   Drug use: Never   Sexual activity: Not on file  Other Topics Concern   Not on file  Social History Narrative   Not on file   Social Drivers of Health   Financial Resource Strain: Not on file  Food Insecurity: Not on file  Transportation Needs: Not on file  Physical Activity: Not on file  Stress: Not on file  Social Connections: Not  on file  Intimate Partner Violence: Not on file    Review of Systems: See HPI, otherwise negative ROS  Physical Exam: BP (!) 145/93   Temp 98.2 F (36.8 C) (Temporal)   Resp 12   Ht 5' 5.98 (1.676 m)   Wt 69.4 kg   SpO2 99%   BMI 24.69 kg/m  General:   Alert and cooperative in NAD Head:  Normocephalic and atraumatic. Respiratory:  Normal work of breathing.  Impression/Plan: Kirk Rocha is here for periocular surgery.  Risks, benefits, limitations, and alternatives regarding surgery have been reviewed with the patient.  Questions have been answered.  All parties agreeable.   Kirk Hermann, MD  11/03/2023, 9:42 AM

## 2023-11-03 NOTE — Transfer of Care (Signed)
 Immediate Anesthesia Transfer of Care Note  Patient: Kirk Rocha  Procedure(s) Performed: REPAIR, BLEPHAROPTOSIS (Bilateral)  Patient Location: PACU  Anesthesia Type: MAC  Level of Consciousness: awake, alert  and patient cooperative  Airway and Oxygen Therapy: Patient Spontanous Breathing and Patient connected to supplemental oxygen  Post-op Assessment: Post-op Vital signs reviewed, Patient's Cardiovascular Status Stable, Respiratory Function Stable, Patent Airway and No signs of Nausea or vomiting  Post-op Vital Signs: Reviewed and stable  Complications: No notable events documented.

## 2023-11-03 NOTE — Anesthesia Postprocedure Evaluation (Signed)
 Anesthesia Post Note  Patient: Demarrius Guerrero  Procedure(s) Performed: REPAIR, BLEPHAROPTOSIS (Bilateral)  Patient location during evaluation: PACU Anesthesia Type: MAC Level of consciousness: awake and alert Pain management: pain level controlled Vital Signs Assessment: post-procedure vital signs reviewed and stable Respiratory status: spontaneous breathing, nonlabored ventilation and respiratory function stable Cardiovascular status: blood pressure returned to baseline and stable Postop Assessment: no apparent nausea or vomiting Anesthetic complications: no   No notable events documented.   Last Vitals:  Vitals:   11/03/23 1055 11/03/23 1100  BP: (!) 145/83   Pulse: 64 66  Resp: 13 15  Temp:    SpO2: 98% 98%    Last Pain:  Vitals:   11/03/23 1100  TempSrc:   PainSc: 0-No pain                 Portia Brittle Izan Miron

## 2023-11-03 NOTE — Anesthesia Preprocedure Evaluation (Signed)
 Anesthesia Evaluation  Patient identified by MRN, date of birth, ID band Patient awake    Reviewed: Allergy & Precautions, NPO status , Patient's Chart, lab work & pertinent test results  History of Anesthesia Complications Negative for: history of anesthetic complications  Airway Mallampati: III  TM Distance: >3 FB Neck ROM: full    Dental  (+) Missing   Pulmonary neg shortness of breath, former smoker   Pulmonary exam normal        Cardiovascular Exercise Tolerance: Good hypertension, (-) angina (-) Past MI Normal cardiovascular exam     Neuro/Psych  Neuromuscular disease  negative psych ROS   GI/Hepatic negative GI ROS,neg GERD  ,,(+) Hepatitis -  Endo/Other  negative endocrine ROSdiabetes    Renal/GU      Musculoskeletal   Abdominal   Peds  Hematology negative hematology ROS (+)   Anesthesia Other Findings Past Medical History: No date: Arthritis No date: Asymptomatic stenosis of left vertebral artery No date: Chronic viral hepatitis C (HCC) No date: Depression No date: Diabetes mellitus without complication (HCC) No date: Dizziness and giddiness No date: Gait abnormality No date: Hepatic fibrosis No date: History of hepatitis C No date: Iron deficiency anemia No date: Occlusion and stenosis of basilar artery No date: Paresthesia No date: Type 2 diabetes mellitus with diabetic polyneuropathy (HCC) No date: Type 2 diabetes mellitus with unspecified diabetic  retinopathy without macular edema (HCC) No date: 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  (HCC)  Past Surgical History: No date: cataract surgery  BMI    Body Mass Index: 24.69 kg/m      Reproductive/Obstetrics negative OB ROS                             Anesthesia Physical Anesthesia Plan  ASA: 3  Anesthesia Plan: MAC   Post-op Pain Management:    Induction: Intravenous  PONV Risk  Score and Plan:   Airway Management Planned: Natural Airway and Nasal Cannula  Additional Equipment:   Intra-op Plan:   Post-operative Plan:   Informed Consent: I have reviewed the patients History and Physical, chart, labs and discussed the procedure including the risks, benefits and alternatives for the proposed anesthesia with the patient or authorized representative who has indicated his/her understanding and acceptance.     Dental Advisory Given  Plan Discussed with: Anesthesiologist, CRNA and Surgeon  Anesthesia Plan Comments: (Patient consented for risks of anesthesia including but not limited to:  - adverse reactions to medications - damage to eyes, teeth, lips or other oral mucosa - nerve damage due to positioning  - sore throat or hoarseness - Damage to heart, brain, nerves, lungs, other parts of body or loss of life  Patient voiced understanding and assent.)       Anesthesia Quick Evaluation

## 2023-11-06 ENCOUNTER — Telehealth: Payer: Self-pay | Admitting: Pharmacist

## 2023-11-06 NOTE — Progress Notes (Signed)
    11/06/2023 Name: Kirk Rocha MRN: [id number] DOB: [birth year]  Chief Complaint  Patient presents with   Diabetes    Kirk Rocha is a 73 y.o. year old male who presented for a telephone visit.   They were referred to the pharmacist by a quality report for assistance in managing diabetes.   TNM DM patient  Subjective:  Care Team: Primary Care Provider: Verena Mems, MD ; Next Scheduled Visit: None Clinical Pharmacist: Aloysius Lewis, PharmD Endocrinologist: Dr. Braulio; 12/14/23 next visit  Medication Access/Adherence  Current Pharmacy:  [facility] [address] ([address]), [place] - [address] [place] ([place]) [place] [address] Phone: [phone] Fax: [phone]   Patient reports affordability concerns with their medications: No  PAP for [name] + Farxiga  Patient reports access/transportation concerns to their pharmacy: No  Patient reports adherence concerns with their medications:  No    Requesting to switch medications to [facility] mail order completely going forward   Diabetes:  Current medications: Farxiga  5mg , Metformin  1000mg  daily, Novolin  70/30- 18U at 9 AM + 15U at 10 PM Medications tried in the past: None  Date of Download: 11/06/23 Avg glucose: 159 167, 135, 172, 156  Had his first lows at night at 69 the other day High mealtime peaks that come down abruptly per the patient  Patient denies hypoglycemic s/sx including dizziness, shakiness, sweating. Patient denies hyperglycemic symptoms including polyuria, polydipsia, polyphagia, nocturia, neuropathy, blurred vision.  Current meal patterns:  Breakfast: 9AM Lunch: 2PM Dinner: 10PM  Current medication access support: HTA Advantage SNP  4/29: A1c 9.1% eGFR 59  Objective:  No results found for: HGBA1C  Lab Results  Component Value Date   CREATININE 1.17 05/19/2021   BUN 25 (H) 05/19/2021   NA 139 05/19/2021   K 4.0 05/19/2021   CL 106 05/19/2021   CO2 24  05/19/2021    No results found for: CHOL, HDL, LDLCALC, LDLDIRECT, TRIG, CHOLHDL  Medications Reviewed Today   Medications were not reviewed in this encounter       Assessment/Plan:   Diabetes: - Currently uncontrolled - Reviewed long term cardiovascular and renal outcomes of uncontrolled blood sugar - Reviewed goal A1c, goal fasting, and goal 2 hour post prandial glucose - Recommend to check glucose continuously with [name]    Follow Up Plan:  - Ask Dr. Braulio about thoughts on metformin  change + Farxiga  increase   - For metformin , had diarrhea with use starting about 3 months ago  - Advised we could change to 2 tab 500mg  XR twice a day or 500mg  IR twice a day- patient would like to get rid of it completely, but advised there may be better options and still gold-standard med - Send Rx for Libre 3+ to [facility] order + reader + pen needles - Needs Novolin  script updated for current dosing- also got Humulin  70/30 due to pharmacy being out of Novolin  at last pick-up (still billed at Novolin  though)    Aloysius Lewis, PharmD [facility]  Phone Number: [phone]

## 2023-11-16 ENCOUNTER — Other Ambulatory Visit: Payer: Self-pay

## 2023-11-16 ENCOUNTER — Other Ambulatory Visit (HOSPITAL_COMMUNITY): Payer: Self-pay

## 2023-11-16 MED ORDER — FREESTYLE LIBRE 3 READER DEVI
0 refills | Status: AC
  Filled 2023-11-16: qty 1, 30d supply, fill #0

## 2023-11-16 MED ORDER — INSULIN PEN NEEDLE 32G X 4 MM MISC
Freq: Two times a day (BID) | 3 refills | Status: AC
  Filled 2023-11-16 – 2024-02-05 (×2): qty 200, 100d supply, fill #0
  Filled 2024-05-27: qty 200, 100d supply, fill #1

## 2023-11-16 MED ORDER — FREESTYLE LIBRE 3 PLUS SENSOR MISC
3 refills | Status: AC
  Filled 2023-11-16: qty 6, 90d supply, fill #0
  Filled 2024-02-26 – 2024-02-27 (×2): qty 6, 90d supply, fill #1
  Filled 2024-05-27: qty 6, 90d supply, fill #2

## 2023-11-18 ENCOUNTER — Other Ambulatory Visit (HOSPITAL_COMMUNITY): Payer: Self-pay

## 2023-11-18 MED ORDER — NOVOLIN 70/30 FLEXPEN (70-30) 100 UNIT/ML ~~LOC~~ SUPN
PEN_INJECTOR | Freq: Every day | SUBCUTANEOUS | 3 refills | Status: DC
  Filled 2023-11-18: qty 30, 30d supply, fill #0
  Filled 2023-11-27: qty 30, 91d supply, fill #0
  Filled 2023-12-15: qty 15, 45d supply, fill #0

## 2023-11-18 MED ORDER — METFORMIN HCL ER 750 MG PO TB24
750.0000 mg | ORAL_TABLET | Freq: Two times a day (BID) | ORAL | 3 refills | Status: AC
  Filled 2023-11-18: qty 180, 90d supply, fill #0

## 2023-11-20 ENCOUNTER — Other Ambulatory Visit (HOSPITAL_COMMUNITY): Payer: Self-pay

## 2023-11-20 ENCOUNTER — Other Ambulatory Visit: Payer: Self-pay

## 2023-11-21 ENCOUNTER — Other Ambulatory Visit (HOSPITAL_BASED_OUTPATIENT_CLINIC_OR_DEPARTMENT_OTHER): Payer: Self-pay

## 2023-11-21 ENCOUNTER — Other Ambulatory Visit (HOSPITAL_COMMUNITY): Payer: Self-pay

## 2023-11-21 MED ORDER — ROSUVASTATIN CALCIUM 10 MG PO TABS
10.0000 mg | ORAL_TABLET | Freq: Every day | ORAL | 1 refills | Status: AC
  Filled 2023-11-21 – 2023-11-27 (×2): qty 90, 90d supply, fill #0
  Filled 2024-02-26 – 2024-02-27 (×2): qty 90, 90d supply, fill #1

## 2023-11-27 ENCOUNTER — Other Ambulatory Visit (HOSPITAL_BASED_OUTPATIENT_CLINIC_OR_DEPARTMENT_OTHER): Payer: Self-pay

## 2023-11-27 ENCOUNTER — Other Ambulatory Visit (HOSPITAL_COMMUNITY): Payer: Self-pay

## 2023-12-15 ENCOUNTER — Other Ambulatory Visit (HOSPITAL_COMMUNITY): Payer: Self-pay

## 2023-12-15 ENCOUNTER — Other Ambulatory Visit: Payer: Self-pay

## 2023-12-18 ENCOUNTER — Other Ambulatory Visit (HOSPITAL_COMMUNITY): Payer: Self-pay

## 2023-12-18 DIAGNOSIS — E11319 Type 2 diabetes mellitus with unspecified diabetic retinopathy without macular edema: Secondary | ICD-10-CM | POA: Diagnosis not present

## 2023-12-18 DIAGNOSIS — K7460 Unspecified cirrhosis of liver: Secondary | ICD-10-CM | POA: Diagnosis not present

## 2023-12-18 DIAGNOSIS — E1165 Type 2 diabetes mellitus with hyperglycemia: Secondary | ICD-10-CM | POA: Diagnosis not present

## 2023-12-18 DIAGNOSIS — E785 Hyperlipidemia, unspecified: Secondary | ICD-10-CM | POA: Diagnosis not present

## 2023-12-18 DIAGNOSIS — I10 Essential (primary) hypertension: Secondary | ICD-10-CM | POA: Diagnosis not present

## 2023-12-18 DIAGNOSIS — I708 Atherosclerosis of other arteries: Secondary | ICD-10-CM | POA: Diagnosis not present

## 2023-12-18 DIAGNOSIS — E559 Vitamin D deficiency, unspecified: Secondary | ICD-10-CM | POA: Diagnosis not present

## 2023-12-19 ENCOUNTER — Other Ambulatory Visit (HOSPITAL_COMMUNITY): Payer: Self-pay

## 2023-12-19 MED ORDER — NOVOLIN 70/30 FLEXPEN (70-30) 100 UNIT/ML ~~LOC~~ SUPN
25.0000 [IU] | PEN_INJECTOR | Freq: Two times a day (BID) | SUBCUTANEOUS | 11 refills | Status: AC
  Filled 2023-12-19 – 2024-02-12 (×3): qty 15, 30d supply, fill #0

## 2023-12-26 ENCOUNTER — Other Ambulatory Visit (HOSPITAL_COMMUNITY): Payer: Self-pay

## 2023-12-29 ENCOUNTER — Other Ambulatory Visit: Payer: Self-pay

## 2023-12-29 ENCOUNTER — Other Ambulatory Visit (HOSPITAL_COMMUNITY): Payer: Self-pay

## 2023-12-29 DIAGNOSIS — N401 Enlarged prostate with lower urinary tract symptoms: Secondary | ICD-10-CM | POA: Diagnosis not present

## 2023-12-29 DIAGNOSIS — E1165 Type 2 diabetes mellitus with hyperglycemia: Secondary | ICD-10-CM | POA: Diagnosis not present

## 2023-12-29 DIAGNOSIS — R350 Frequency of micturition: Secondary | ICD-10-CM | POA: Diagnosis not present

## 2023-12-29 DIAGNOSIS — I10 Essential (primary) hypertension: Secondary | ICD-10-CM | POA: Diagnosis not present

## 2023-12-29 DIAGNOSIS — E785 Hyperlipidemia, unspecified: Secondary | ICD-10-CM | POA: Diagnosis not present

## 2023-12-29 MED ORDER — FINASTERIDE 5 MG PO TABS
5.0000 mg | ORAL_TABLET | Freq: Every day | ORAL | 3 refills | Status: AC
  Filled 2023-12-29: qty 90, 90d supply, fill #0

## 2023-12-29 MED ORDER — OLMESARTAN MEDOXOMIL 5 MG PO TABS
5.0000 mg | ORAL_TABLET | Freq: Every day | ORAL | 3 refills | Status: AC
  Filled 2023-12-29: qty 100, 50d supply, fill #0
  Filled 2024-02-26 – 2024-02-27 (×2): qty 100, 50d supply, fill #1
  Filled 2024-05-06: qty 100, 50d supply, fill #2

## 2023-12-29 MED ORDER — TAMSULOSIN HCL 0.4 MG PO CAPS
0.8000 mg | ORAL_CAPSULE | Freq: Every day | ORAL | 3 refills | Status: AC
  Filled 2023-12-29: qty 180, 90d supply, fill #0

## 2024-01-04 ENCOUNTER — Telehealth: Payer: Self-pay | Admitting: Pharmacist

## 2024-01-04 ENCOUNTER — Other Ambulatory Visit (HOSPITAL_COMMUNITY): Payer: Self-pay

## 2024-01-04 NOTE — Progress Notes (Addendum)
 01/04/2024 Name: Kirk Rocha MRN: [id number] DOB: 09/15/1950  Chief Complaint  Patient presents with   Diabetes    Kirk Rocha is a 73 y.o. year old male who presented for a telephone visit.   They were referred to the pharmacist by a quality report for assistance in managing diabetes.   TNM DM patient  Subjective:  Care Team: Primary Care Provider: Chet Mad, DO ; Next Scheduled Visit: 03/19/24 Clinical Pharmacist: Aloysius Lewis, PharmD Endocrinologist: Dr. Braulio; 03/21/24 next visit  Medication Access/Adherence  Current Pharmacy:  [facility] [address] ([address]), [place] - [address] [place] ([place]) [place] [address] Phone: [phone] Fax: [phone]   Patient reports affordability concerns with their medications: No  PAP for [name] + Farxiga  Patient reports access/transportation concerns to their pharmacy: No  Patient reports adherence concerns with their medications:  No    Requesting to switch medications to [facility] mail order completely going forward   Diabetes:  Current medications: Farxiga  5mg , Metformin  XR 750mg  daily, Novolin  70/30- 18U at 9 AM + 15U at 10 PM Medications tried in the past: None  Libre info on 8/21: 14 days: Avg glucose: 183 205, 162, 202, 162  Above: 31% Target: 69% Below: 0%  Libre info on 6/23: Avg glucose: 159 167, 135, 172, 156  Had his first lows at night at 69 the other day High mealtime peaks that come down abruptly per the patient  Patient denies hypoglycemic s/sx including dizziness, shakiness, sweating. Patient denies hyperglycemic symptoms including polyuria, polydipsia, polyphagia, nocturia, neuropathy, blurred vision.  Current meal patterns:  Breakfast: 9AM Lunch: 2PM Dinner: 10PM  Current medication access support: HTA Advantage SNP  4/29: A1c 9.1% 8/4: A1c 8.6% eGFR 59  Objective:  No results found for: HGBA1C  Lab Results  Component Value Date    CREATININE 1.17 05/19/2021   BUN 25 (H) 05/19/2021   NA 139 05/19/2021   K 4.0 05/19/2021   CL 106 05/19/2021   CO2 24 05/19/2021    No results found for: CHOL, HDL, LDLCALC, LDLDIRECT, TRIG, CHOLHDL  Medications Reviewed Today   Medications were not reviewed in this encounter       Assessment/Plan:   Diabetes: - Currently uncontrolled - Reviewed long term cardiovascular and renal outcomes of uncontrolled blood sugar - Reviewed goal A1c, goal fasting, and goal 2 hour post prandial glucose - Recommend to check glucose continuously with [name]    Follow Up Plan:  - Ask Dr. Braulio on Monday about insulin  dose changes recommended - Currently taking Metformin  1000mg  as 1 tablet daily x 3 days per week for bowel movements only  - Advised to try the Metformin  XR 750mg  daily instead to see if that helps instead; will try it - Advised insulin  dosing has been updated for next fill; messaged the pharmacy to have the old script discontinued  - Reports having insulin  vial and syringes for emergencies in the future - Change to Novolin  70/30 to 20U in AM + 17U in PM  Update from 8/26: Spoke with the patient on the phone today. Advised to change to Novolin  70/30 to 20U in AM + 17U in PM. Confirmed understanding. Has tried the Metformin  XR 750mg  starting Saturday. May or may not have noticed a change- too soon to tell. Will continue to take it for now with at least 1 tablet to see how it does. Reports also having a false sensor that was reading too low- called [name] and had  it replaced. Reading within normal range once again. No further concerns at this time.    Aloysius Lewis, PharmD [facility]  Phone Number: [phone]

## 2024-01-12 DIAGNOSIS — H02401 Unspecified ptosis of right eyelid: Secondary | ICD-10-CM | POA: Diagnosis not present

## 2024-02-05 ENCOUNTER — Other Ambulatory Visit (HOSPITAL_COMMUNITY): Payer: Self-pay

## 2024-02-06 ENCOUNTER — Other Ambulatory Visit: Payer: Self-pay

## 2024-02-06 ENCOUNTER — Other Ambulatory Visit (HOSPITAL_COMMUNITY): Payer: Self-pay

## 2024-02-06 MED ORDER — NOVOLIN 70/30 FLEXPEN (70-30) 100 UNIT/ML ~~LOC~~ SUPN
PEN_INJECTOR | SUBCUTANEOUS | 3 refills | Status: AC
  Filled 2024-02-06: qty 30, 81d supply, fill #0
  Filled 2024-05-06: qty 30, 81d supply, fill #1

## 2024-02-12 ENCOUNTER — Other Ambulatory Visit (HOSPITAL_COMMUNITY): Payer: Self-pay

## 2024-02-12 ENCOUNTER — Other Ambulatory Visit: Payer: Self-pay

## 2024-02-14 ENCOUNTER — Other Ambulatory Visit: Payer: Self-pay

## 2024-02-14 ENCOUNTER — Other Ambulatory Visit (HOSPITAL_COMMUNITY): Payer: Self-pay

## 2024-02-22 DIAGNOSIS — R42 Dizziness and giddiness: Secondary | ICD-10-CM | POA: Diagnosis not present

## 2024-02-26 ENCOUNTER — Other Ambulatory Visit (HOSPITAL_COMMUNITY): Payer: Self-pay

## 2024-02-26 ENCOUNTER — Other Ambulatory Visit: Payer: Self-pay

## 2024-02-27 ENCOUNTER — Other Ambulatory Visit: Payer: Self-pay

## 2024-03-18 ENCOUNTER — Other Ambulatory Visit (HOSPITAL_COMMUNITY): Payer: Self-pay

## 2024-03-18 DIAGNOSIS — L249 Irritant contact dermatitis, unspecified cause: Secondary | ICD-10-CM | POA: Diagnosis not present

## 2024-03-18 DIAGNOSIS — L2989 Other pruritus: Secondary | ICD-10-CM | POA: Diagnosis not present

## 2024-03-18 MED ORDER — FLUOCINONIDE 0.05 % EX OINT
TOPICAL_OINTMENT | CUTANEOUS | 0 refills | Status: DC
  Filled 2024-03-18: qty 60, 30d supply, fill #0

## 2024-03-19 ENCOUNTER — Other Ambulatory Visit (HOSPITAL_COMMUNITY): Payer: Self-pay

## 2024-03-19 ENCOUNTER — Other Ambulatory Visit: Payer: Self-pay

## 2024-03-19 DIAGNOSIS — D649 Anemia, unspecified: Secondary | ICD-10-CM | POA: Diagnosis not present

## 2024-03-19 DIAGNOSIS — N401 Enlarged prostate with lower urinary tract symptoms: Secondary | ICD-10-CM | POA: Diagnosis not present

## 2024-03-19 DIAGNOSIS — Z0000 Encounter for general adult medical examination without abnormal findings: Secondary | ICD-10-CM | POA: Diagnosis not present

## 2024-03-19 DIAGNOSIS — E1165 Type 2 diabetes mellitus with hyperglycemia: Secondary | ICD-10-CM | POA: Diagnosis not present

## 2024-03-19 DIAGNOSIS — Z1331 Encounter for screening for depression: Secondary | ICD-10-CM | POA: Diagnosis not present

## 2024-03-19 DIAGNOSIS — E7800 Pure hypercholesterolemia, unspecified: Secondary | ICD-10-CM | POA: Diagnosis not present

## 2024-03-19 DIAGNOSIS — Z23 Encounter for immunization: Secondary | ICD-10-CM | POA: Diagnosis not present

## 2024-03-19 DIAGNOSIS — H6991 Unspecified Eustachian tube disorder, right ear: Secondary | ICD-10-CM | POA: Diagnosis not present

## 2024-03-19 DIAGNOSIS — K513 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: Secondary | ICD-10-CM | POA: Diagnosis not present

## 2024-03-19 MED ORDER — ROSUVASTATIN CALCIUM 20 MG PO TABS
20.0000 mg | ORAL_TABLET | Freq: Every day | ORAL | 3 refills | Status: AC
  Filled 2024-03-19: qty 90, 90d supply, fill #0

## 2024-03-19 MED ORDER — FINASTERIDE 5 MG PO TABS
5.0000 mg | ORAL_TABLET | Freq: Every day | ORAL | 3 refills | Status: AC
  Filled 2024-03-19: qty 90, 90d supply, fill #0

## 2024-03-19 MED ORDER — TAMSULOSIN HCL 0.4 MG PO CAPS
0.8000 mg | ORAL_CAPSULE | Freq: Every day | ORAL | 3 refills | Status: AC
  Filled 2024-03-19: qty 180, 90d supply, fill #0

## 2024-03-21 ENCOUNTER — Other Ambulatory Visit (HOSPITAL_COMMUNITY): Payer: Self-pay

## 2024-03-21 ENCOUNTER — Other Ambulatory Visit: Payer: Self-pay

## 2024-03-22 ENCOUNTER — Other Ambulatory Visit (HOSPITAL_COMMUNITY): Payer: Self-pay

## 2024-04-02 DIAGNOSIS — K5120 Ulcerative (chronic) proctitis without complications: Secondary | ICD-10-CM | POA: Diagnosis not present

## 2024-04-03 ENCOUNTER — Encounter: Payer: Self-pay | Admitting: Ophthalmology

## 2024-04-03 NOTE — Anesthesia Preprocedure Evaluation (Addendum)
 Anesthesia Evaluation  Patient identified by MRN, date of birth, ID band Patient awake    Reviewed: Allergy & Precautions, H&P , NPO status , Patient's Chart, lab work & pertinent test results  Airway Mallampati: III  TM Distance: >3 FB Neck ROM: Full    Dental no notable dental hx. (+) Missing   Pulmonary former smoker   Pulmonary exam normal breath sounds clear to auscultation       Cardiovascular hypertension, negative cardio ROS Normal cardiovascular exam Rhythm:Regular Rate:Normal     Neuro/Psych  PSYCHIATRIC DISORDERS  Depression     Neuromuscular disease negative neurological ROS  negative psych ROS   GI/Hepatic negative GI ROS, Neg liver ROS, PUD,,,(+) Hepatitis -  Endo/Other  diabetes    Renal/GU negative Renal ROS  negative genitourinary   Musculoskeletal negative musculoskeletal ROS (+) Arthritis ,    Abdominal   Peds negative pediatric ROS (+)  Hematology negative hematology ROS (+) Blood dyscrasia, anemia   Anesthesia Other Findings Last dose zepbound 04-03-24  Previous bleph  11-03-23 Dr. 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 Hepatic fibrosis Chronic viral hepatitis C     Reproductive/Obstetrics negative OB ROS                              Anesthesia Physical Anesthesia Plan  ASA: 3  Anesthesia Plan: MAC    Post-op Pain Management:    Induction: Intravenous  PONV Risk Score and Plan:   Airway Management Planned: Natural Airway and Nasal Cannula  Additional Equipment:   Intra-op Plan:   Post-operative Plan:   Informed Consent: I have reviewed the patients History and Physical, chart, labs and discussed the procedure including the risks, benefits and alternatives for the proposed anesthesia with the patient or authorized representative who has indicated his/her understanding and acceptance.     Dental Advisory Given  Plan Discussed with: Anesthesiologist, CRNA and Surgeon  Anesthesia Plan Comments: (Patient consented for risks of anesthesia including but not limited to:  - adverse reactions to medications - damage to eyes, teeth, lips or other oral mucosa - nerve damage due to positioning  - sore throat or hoarseness - Damage to heart, brain, nerves, lungs, other parts of body or loss of life  Patient voiced understanding and assent.)         Anesthesia Quick Evaluation

## 2024-04-05 ENCOUNTER — Encounter: Payer: Self-pay | Admitting: Ophthalmology

## 2024-04-05 ENCOUNTER — Ambulatory Visit: Payer: Self-pay

## 2024-04-05 ENCOUNTER — Encounter
Admission: RE | Disposition: A | Discharge status: Home / Self Care | Payer: Self-pay | Source: Home / Self Care | Attending: Ophthalmology

## 2024-04-05 ENCOUNTER — Other Ambulatory Visit: Payer: Self-pay

## 2024-04-05 ENCOUNTER — Ambulatory Visit
Admission: RE | Admit: 2024-04-05 | Discharge: 2024-04-05 | Disposition: A | Discharge status: Home / Self Care | Attending: Ophthalmology | Admitting: Ophthalmology

## 2024-04-05 DIAGNOSIS — E1142 Type 2 diabetes mellitus with diabetic polyneuropathy: Secondary | ICD-10-CM | POA: Diagnosis not present

## 2024-04-05 DIAGNOSIS — Z794 Long term (current) use of insulin: Secondary | ICD-10-CM | POA: Insufficient documentation

## 2024-04-05 DIAGNOSIS — E7800 Pure hypercholesterolemia, unspecified: Secondary | ICD-10-CM | POA: Diagnosis not present

## 2024-04-05 DIAGNOSIS — Z87891 Personal history of nicotine dependence: Secondary | ICD-10-CM | POA: Insufficient documentation

## 2024-04-05 DIAGNOSIS — H02401 Unspecified ptosis of right eyelid: Secondary | ICD-10-CM | POA: Diagnosis not present

## 2024-04-05 DIAGNOSIS — Z8711 Personal history of peptic ulcer disease: Secondary | ICD-10-CM | POA: Insufficient documentation

## 2024-04-05 DIAGNOSIS — E11319 Type 2 diabetes mellitus with unspecified diabetic retinopathy without macular edema: Secondary | ICD-10-CM | POA: Insufficient documentation

## 2024-04-05 DIAGNOSIS — Z7984 Long term (current) use of oral hypoglycemic drugs: Secondary | ICD-10-CM | POA: Insufficient documentation

## 2024-04-05 DIAGNOSIS — Z8619 Personal history of other infectious and parasitic diseases: Secondary | ICD-10-CM | POA: Insufficient documentation

## 2024-04-05 DIAGNOSIS — I10 Essential (primary) hypertension: Secondary | ICD-10-CM | POA: Diagnosis not present

## 2024-04-05 HISTORY — PX: PTOSIS REPAIR: SHX6568

## 2024-04-05 LAB — GLUCOSE, CAPILLARY: Glucose-Capillary: 101 mg/dL — ABNORMAL HIGH (ref 70–99)

## 2024-04-05 SURGERY — REPAIR, BLEPHAROPTOSIS
Anesthesia: Monitor Anesthesia Care | Site: Eye | Laterality: Right

## 2024-04-05 MED ORDER — LIDOCAINE-EPINEPHRINE 2 %-1:100000 IJ SOLN
INTRAMUSCULAR | Status: DC | PRN
  Administered 2024-04-05: .5 mL via OPHTHALMIC

## 2024-04-05 MED ORDER — DEXMEDETOMIDINE HCL IN NACL 200 MCG/50ML IV SOLN
INTRAVENOUS | Status: DC | PRN
  Administered 2024-04-05: 4 ug via INTRAVENOUS

## 2024-04-05 MED ORDER — LIDOCAINE HCL (PF) 2 % IJ SOLN
INTRAMUSCULAR | Status: AC
  Filled 2024-04-05: qty 5

## 2024-04-05 MED ORDER — TETRACAINE HCL 0.5 % OP SOLN
OPHTHALMIC | Status: DC | PRN
Start: 2024-04-05 — End: 2024-04-05
  Administered 2024-04-05: 2 [drp] via OPHTHALMIC

## 2024-04-05 MED ORDER — PROPOFOL 10 MG/ML IV BOLUS
INTRAVENOUS | Status: AC
  Filled 2024-04-05: qty 20

## 2024-04-05 MED ORDER — TRAMADOL HCL 50 MG PO TABS: ORAL_TABLET | ORAL | 0 refills | Status: AC

## 2024-04-05 MED ORDER — DEXMEDETOMIDINE HCL IN NACL 80 MCG/20ML IV SOLN
INTRAVENOUS | Status: AC
  Filled 2024-04-05: qty 40

## 2024-04-05 MED ORDER — BSS IO SOLN
INTRAOCULAR | Status: DC | PRN
  Administered 2024-04-05: 15 mL via INTRAOCULAR

## 2024-04-05 MED ORDER — LIDOCAINE 2% (20 MG/ML) 5 ML SYRINGE
INTRAMUSCULAR | Status: DC | PRN
  Administered 2024-04-05: 40 mg via INTRAVENOUS

## 2024-04-05 MED ORDER — ERYTHROMYCIN 5 MG/GM OP OINT
TOPICAL_OINTMENT | OPHTHALMIC | 2 refills | Status: AC
Start: 2024-04-05 — End: ?

## 2024-04-05 MED ORDER — LACTATED RINGERS IV SOLN: INTRAVENOUS | Status: DC

## 2024-04-05 MED ORDER — DEXTROSE 50 % IV SOLN
INTRAVENOUS | Status: AC
  Filled 2024-04-05: qty 50

## 2024-04-05 MED ORDER — PROPOFOL 10 MG/ML IV BOLUS
INTRAVENOUS | Status: DC | PRN
  Administered 2024-04-05 (×2): 20 mg via INTRAVENOUS

## 2024-04-05 MED ORDER — ERYTHROMYCIN 5 MG/GM OP OINT
TOPICAL_OINTMENT | OPHTHALMIC | Status: DC | PRN
  Administered 2024-04-05: 1 via OPHTHALMIC

## 2024-04-05 MED ORDER — DEXMEDETOMIDINE HCL IN NACL 80 MCG/20ML IV SOLN
INTRAVENOUS | Status: AC
  Filled 2024-04-05: qty 20

## 2024-04-05 MED ORDER — DEXTROSE 50 % IV SOLN
5.0000 mL | Freq: Once | INTRAVENOUS | Status: AC
  Administered 2024-04-05: 5 mL via INTRAVENOUS

## 2024-04-05 SURGICAL SUPPLY — 20 items
APPLICATOR COT TIP 3IN STRL (MISCELLANEOUS) ×10 IMPLANT
BLADE SURG 15 STRL LF DISP TIS (BLADE) ×6 IMPLANT
CORD BIP STRL DISP 12FT (MISCELLANEOUS) ×2 IMPLANT
GAUZE SPONGE 2X2 STRL 8-PLY (GAUZE/BANDAGES/DRESSINGS) ×20 IMPLANT
GAUZE SPONGE 4X4 12PLY STRL (GAUZE/BANDAGES/DRESSINGS) ×4 IMPLANT
GLOVE SURG LX STRL 7.0 MICRO (GLOVE) ×4 IMPLANT
GOWN STRL REUS W/ TWL LRG LVL3 (GOWN DISPOSABLE) ×2 IMPLANT
MARKER SKIN XFINE TIP W/RULER (MISCELLANEOUS) ×2 IMPLANT
NDL 18GX1X1/2 (RX/OR ONLY) (NEEDLE) ×2 IMPLANT
NDL HYPO 30X.5 LL (NEEDLE) ×4 IMPLANT
NEEDLE 18GX1X1/2 (RX/OR ONLY) (NEEDLE) ×1 IMPLANT
NEEDLE HYPO 30X.5 LL (NEEDLE) ×2 IMPLANT
PACK ENT CUSTOM (PACKS) ×2 IMPLANT
SOLUTION PREP PVP 2OZ (MISCELLANEOUS) ×2 IMPLANT
STRAP BODY AND KNEE 60X3 (MISCELLANEOUS) IMPLANT
SUT GUT PLAIN 6-0 1X18 ABS (SUTURE) ×2 IMPLANT
SUT PROLENE 6 0 P 1 18 (SUTURE) IMPLANT
SYR 10ML LL (SYRINGE) ×2 IMPLANT
SYR 3ML LL SCALE MARK (SYRINGE) ×2 IMPLANT
WATER STERILE IRR 250ML POUR (IV SOLUTION) ×2 IMPLANT

## 2024-04-05 NOTE — Discharge Instructions (Signed)
 INSTRUCTIONS FOLLOWING OCULOPLASTIC SURGERY AMY EMERSON GAY, MD  AFTER YOUR EYE SURGERY, THER ARE MANY THINGS WHICH YOU, THE PATIENT, CAN DO TO ASSURE THE BEST POSSIBLE RESULT FROM YOUR OPERATION.  THIS SHEET SHOULD BE REFERRED TO WHENEVER QUESTIONS ARISE.  IF THERE ARE ANY QUESTIONS NOT ANSWERED HERE, DO NOT HESITATE TO CALL OUR OFFICE AT [phone] OR [phone].  THERE IS ALWAYS SOMEONE AVAILABLE TO CALL IF QUESTIONS OR PROBLEMS ARISE.  VISION: Your vision may be blurred and out of focus after surgery until you are able to stop using your ointment, swelling resolves and your eye(s) heal. This may take 1 to 2 weeks at the least.  If your vision becomes gradually more dim or dark, this is not normal and you need to call our office immediately.  EYE CARE: For the first 48 hours after surgery, use ice packs frequently - "20 minutes on, 20 minutes off" - to help reduce swelling and bruising.  Small bags of frozen peas or corn make good ice packs along with cloths soaked in ice water.  If you are wearing a patch or other type of dressing following surgery, keep this on for the amount of time specified by your doctor.  For the first week following surgery, you will need to treat your stitches with great care.  It is OK to shower, but take care to not allow soapy water to run into your eye(s) to help reduce chances of infection.  You may gently clean the eyelashes and around the eye(s) with cotton balls and bottled water, BUT DO NOT RUB THE STITCHES VIGOROUSLY.  Keeping your stitches moist with ointment will help promote healing with minimal scar formation.  ACTIVITY: When you leave the surgery center, you should go home, rest and be inactive.  The eye(s) may feel scratchy and keeping the eyes closed will allow for faster healing.  The first week following surgery, avoid straining (anything making the face turn red) or lifting over 20 pounds.  Additionally, avoid bending which causes your head to go below  your waist.  Using your eyes will NOT harm them, so feel free to read, watch television, use the computer, etc as desired.  Driving depends on each individual, so check with your doctor if you have questions about driving. Do not wear contact lenses for about 2 weeks.  Do not wear eye makeup for 2 weeks.  Avoid swimming, hot tubs, gardening, and dusting for 1 to 2 weeks to reduce the risk of an infection.  MEDICATIONS:  You will be given a prescription for an ointment to use 4 times a day on your stitches.  You can use the ointment in your eyes if they feel scratchy or irritated.  If you eyelid(s) don't close completely when you sleep, put some ointment in your eyes before bedtime.  EMERGENCY: If you experience SEVERE EYE PAIN OR HEADACHE UNRELIEVED BY TYLENOL OR TRAMADOL , NAUSEA OR VOMITING, WORSENING REDNESS, OR WORSENING VISION (ESPECIALLY VISION THAT WAS INITIALLY BETTER) CALL [phone] OR [phone] DURING BUSINESS HOURS OR AFTER HOURS.

## 2024-04-05 NOTE — Transfer of Care (Signed)
 Immediate Anesthesia Transfer of Care Note  Patient: Kirk Rocha  Procedure(s) Performed: REPAIR, BLEPHAROPTOSIS (Right: Eye)  Patient Location: PACU  Anesthesia Type:MAC  Level of Consciousness: awake, alert , and oriented  Airway & Oxygen Therapy: Patient Spontanous Breathing  Post-op Assessment: Report given to RN and Post -op Vital signs reviewed and stable  Post vital signs: Reviewed and stable.  Last Vitals:  Vitals Value Taken Time  BP 139/85 04/05/24 08:22  Temp 36.8 C 04/05/24 08:23  Pulse 72 04/05/24 08:25  Resp 14 04/05/24 08:25  SpO2 99 % 04/05/24 08:25  Vitals shown include unfiled device data.  Last Pain:  Vitals:   04/05/24 0823  PainSc: (P) 0-No pain         Complications: No notable events documented.

## 2024-04-05 NOTE — Op Note (Signed)
 Preoperative Diagnosis:  Visually significant blepharoptosis right  Upper Eyelid(s)  Postoperative Diagnosis:  Same.  Procedure(s) Performed:   Blepharoptosis repair with levator aponeurosis advancement right  Upper Eyelid(s)  Teaching Surgeon: Greig HERO. Ashley, M.D.  Assistants: none  Anesthesia: MAC  Specimens: None.  Estimated Blood Loss: Minimal.  Complications: None.  Operative Findings: None Dictated  PROCEDURE:  Allergies were reviewed and the patient is allergic to codeine..   After the risks, benefits, complications and alternatives were discussed with the patient, appropriate informed consent was obtained. While seated in an upright position and looking in primary gaze, the mid pupillary line was marked on the upper eyelid margins bilaterally. The patient was then brought to the operating suite and reclined supine.  Timeout was conducted and the patient was sedated. Local anesthetic consisting of a 50-50 mixture of 2% lidocaine  with epinephrine  and 0.75% bupivacaine  with added Hylenex  was injected subcutaneously to the right  upper eyelid(s). After adequate local was instilled, the patient was prepped and draped in the usual sterile fashion for eyelid surgery.   Attention was turned to the upper eyelids. A 9mm upper eyelid crease incision line was marked with calipers on right  upper eyelid(s).  Attention was turned to the  right  upper eyelid. A #15 blade was used to open the premarked incision line and hemostasis was obtained with bipolar cautery. Westcott scissors were then used to transect through orbicularis for the length of the incision down to the tarsal plate. Epitarsus was dissected to create a smooth surface to suture to. Previously placed sutures were excised. Dissection was then carried superiorly in the plane between orbicularis and orbital septum. Once the preaponeurotic fat pocket was identified, the orbital septum was opened. This revealed the levator and its  aponeurosis.    3 interrupted 6-0 Prolene sutures were then passed partial thickness through the tarsal plates of right  upper eyelid(s). These sutures were placed in line with the mid pupillary, medial limbal, and lateral limbal lines. The sutures were fixed to the levator aponeurosis and adjusted until a nice lid height and contour were achieved. Once nice symmetry was achieved, the skin incisions were closed with a running 6-0  plain gut suture. The patient tolerated the procedure well. Erythromycin  ophthalmic ointment was applied to the incision site(s) followed by ice packs. The patient was taken to the recovery area where he recovered without difficulty.  Post-Op Plan/Instructions:  Mr. Mcgue was instructed to use ice packs frequently for the next 48 hours. His was instructed to use Erythromycin  ophthalmic ointment on his incisions 4 times a day for the next 12 to 14 days. He was given a prescription for tramadol  (or similar) for pain control should Tylenol not be effective. He was asked to to follow up in 2-3 weeks' time at the [facility] in [place], [place] or sooner as needed for problems.  Deaja Rizo M. Ashley, M.D. Ophthalmology

## 2024-04-05 NOTE — Interval H&P Note (Signed)
 History and Physical Interval Note:  04/05/2024 7:37 AM  Kirk Rocha  has presented today for surgery, with the diagnosis of Right Ptosis of Eyelid.  The various methods of treatment have been discussed with the patient and family. After consideration of risks, benefits and other options for treatment, the patient has consented to  Procedure(s): REPAIR, BLEPHAROPTOSIS (Right) as a surgical intervention.  The patient's history has been reviewed, patient examined, no change in status, stable for surgery.  I have reviewed the patient's chart and labs.  Questions were answered to the patient's satisfaction.     Ashley, Harjit Leider M

## 2024-04-05 NOTE — Anesthesia Postprocedure Evaluation (Signed)
 Anesthesia Post Note  Patient: Kirk Rocha  Procedure(s) Performed: REPAIR, BLEPHAROPTOSIS (Right: Eye)  Patient location during evaluation: PACU Anesthesia Type: MAC Level of consciousness: awake and alert Pain management: pain level controlled Vital Signs Assessment: post-procedure vital signs reviewed and stable Respiratory status: spontaneous breathing, nonlabored ventilation, respiratory function stable and patient connected to nasal cannula oxygen Cardiovascular status: stable and blood pressure returned to baseline Postop Assessment: no apparent nausea or vomiting Anesthetic complications: no   No notable events documented.   Last Vitals:  Vitals:   04/05/24 0823 04/05/24 0830  BP:  (!) 142/77  Pulse: 65 66  Resp:  12  Temp: 36.8 C   SpO2: 99% 99%    Last Pain:  Vitals:   04/05/24 0830  PainSc: 0-No pain                 Aundrea Horace C Euriah Matlack

## 2024-04-05 NOTE — H&P (Signed)
 [facility]: [facility]  Primary Care Physician:  Chet Mad, DO Ophthalmologist: Dr. Greig HERO. Ashley, M.D.  Pre-Procedure History & Physical: HPI:  Kirk Rocha is a 73 y.o. male here for periocular surgery.   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 Authorizing Provider  aspirin EC 325 MG tablet Take 325 mg by mouth daily. Patient taking differently: Take 325 mg by mouth daily. Stopped for surgery   Yes [provider]  Cholecalciferol (D3) 25 MCG (1000 UT) capsule Take 1,000 Units by mouth daily.   Yes [provider]  dapagliflozin  propanediol (FARXIGA ) 5 MG TABS tablet Take 1 tablet (5 mg total) by mouth daily. 03/29/22  Yes   fenofibrate  (TRICOR ) 145 MG tablet Take 1 tablet (145 mg total) by mouth daily. 06/03/23  Yes   ferrous sulfate 325 (65 FE) MG EC tablet Take 325 mg by mouth daily.   Yes [provider]  finasteride  (PROSCAR ) 5 MG tablet Take 1 tablet (5 mg total) by mouth daily. 12/29/23  Yes   finasteride  (PROSCAR ) 5 MG tablet Take 1  tablet (5 mg total) by mouth daily. 03/19/24  Yes   insulin  isophane & regular human KwikPen (NOVOLIN  70/30 KWIKPEN) (70-30) 100 UNIT/ML KwikPen Inject up to 25 Units into the skin 2 (two) times daily before meals. Max daily dose 50 units 12/19/23  Yes   metFORMIN  (GLUCOPHAGE ) 1000 MG tablet Take 1 tablet (1,000 mg total) by mouth daily. 10/30/23  Yes   metFORMIN  (GLUCOPHAGE -XR) 750 MG 24 hr tablet Take 1 tablet (750 mg total) by mouth 2 (two) times daily. Stop 1000 mg tablets. 11/16/23  Yes   olmesartan  (BENICAR ) 5 MG tablet Take 5 mg by mouth 2 (two) times daily. 01/13/20  Yes [provider]  olmesartan  (BENICAR ) 5 MG tablet Take 1 tablet (5 mg total) by mouth daily. May take an extra tablet if SBP > 140. 12/29/23  Yes   rosuvastatin  (CRESTOR ) 10 MG tablet Take 10 mg by mouth daily.   Yes [provider]  rosuvastatin  (CRESTOR ) 10 MG tablet Take 1 tablet (10 mg total) by mouth daily. 11/21/23  Yes   rosuvastatin  (CRESTOR ) 20 MG tablet Take 1 tablet (20 mg total) by mouth daily. 03/19/24  Yes   tamsulosin  (FLOMAX ) 0.4 MG CAPS capsule Take 0.4 mg by mouth daily. 12/10/19  Yes [provider]  tamsulosin  (FLOMAX ) 0.4 MG CAPS capsule Take 2 capsules (0.8 mg total) by mouth daily. 12/29/23  Yes   Tofacitinib Citrate (XELJANZ) 10  MG TABS Take 10 mg by mouth 2 (two) times daily.   Yes [provider]  Continuous Glucose Receiver (FREESTYLE LIBRE 3 READER) DEVI Use to check blood sugar continuously. 11/16/23   Braulio Hough, MD  Continuous Glucose Sensor (FREESTYLE LIBRE 3 PLUS SENSOR) MISC Use 1 sensor every 15 days to monitor blood sugars continuously 11/16/23     erythromycin  ophthalmic ointment Apply to sutures 4 times a day for 10-12 days.  Discontinue if allergy develops and call our office 11/03/23   Ashley Greig HERO, MD  fluocinonide  ointment (LIDEX ) 0.05 % Apply to hands and nails twice daily 03/18/24     insulin  isophane & regular human KwikPen (NOVOLIN  70/30 KWIKPEN) (70-30) 100  UNIT/ML KwikPen Inject 20 Units into the skin in the morning AND 17 Units every evening. 02/06/24     Insulin  Pen Needle 32G X 4 MM MISC Use to inject insulin  2 (two) times daily. 11/16/23   Braulio Hough, MD  Insulin  Syringe-Needle U-100 (BD INSULIN  SYRINGE U/F) 31G X 5/16 0.3 ML MISC Use to inject insulin  2 times a day 11/29/21   Verena Mems, MD  tamsulosin  (FLOMAX ) 0.4 MG CAPS capsule Take 2 capsules (0.8 mg total) by mouth daily. 03/19/24       Allergies as of 01/03/2024 - Review Complete 11/03/2023  Allergen Reaction Noted   Codeine Other (See Comments) 01/01/2020    History reviewed. No pertinent family history.  Social History   Socioeconomic History   Marital status: Married    Spouse name: Not on file   Number of children: Not on file   Years of education: Not on file   Highest education level: Not on file  Occupational History   Not on file  Tobacco Use   Smoking status: Former    Current packs/day: 0.00    Types: Cigarettes    Quit date: 05/16/2008    Years since quitting: 15.8   Smokeless tobacco: Never  Vaping Use   Vaping status: Never Used  Substance and Sexual Activity   Alcohol use: Never   Drug use: Never   Sexual activity: Not on file  Other Topics Concern   Not on file  Social History Narrative   Not on file   Social Drivers of Health   Financial Resource Strain: Not on file  Food Insecurity: Not on file  Transportation Needs: Not on file  Physical Activity: Not on file  Stress: Not on file  Social Connections: Not on file  Intimate Partner Violence: Not on file    Review of Systems: See HPI, otherwise negative ROS  Physical Exam: BP 136/78   Pulse 67   Temp 98.4 F (36.9 C)   Ht 5' 6 (1.676 m)   Wt 69.4 kg   SpO2 97%   BMI 24.69 kg/m  General:   Alert and cooperative in NAD Head:  Normocephalic and atraumatic. Respiratory:  Normal work of breathing.  Impression/Plan: Kirk Rocha is here for periocular surgery.  Risks,  benefits, limitations, and alternatives regarding surgery have been reviewed with the patient.  Questions have been answered.  All parties agreeable.   Ashley Greig HERO, MD  04/05/2024, 7:37 AM

## 2024-04-16 DIAGNOSIS — E11319 Type 2 diabetes mellitus with unspecified diabetic retinopathy without macular edema: Secondary | ICD-10-CM | POA: Diagnosis not present

## 2024-04-16 DIAGNOSIS — E1169 Type 2 diabetes mellitus with other specified complication: Secondary | ICD-10-CM | POA: Diagnosis not present

## 2024-04-16 DIAGNOSIS — E538 Deficiency of other specified B group vitamins: Secondary | ICD-10-CM | POA: Diagnosis not present

## 2024-04-16 DIAGNOSIS — E1165 Type 2 diabetes mellitus with hyperglycemia: Secondary | ICD-10-CM | POA: Diagnosis not present

## 2024-04-16 DIAGNOSIS — I10 Essential (primary) hypertension: Secondary | ICD-10-CM | POA: Diagnosis not present

## 2024-04-16 DIAGNOSIS — E559 Vitamin D deficiency, unspecified: Secondary | ICD-10-CM | POA: Diagnosis not present

## 2024-04-30 ENCOUNTER — Other Ambulatory Visit (HOSPITAL_COMMUNITY): Payer: Self-pay

## 2024-04-30 MED ORDER — FLUOCINONIDE 0.05 % EX OINT
TOPICAL_OINTMENT | CUTANEOUS | 11 refills | Status: AC
  Filled 2024-04-30: qty 60, 30d supply, fill #0

## 2024-05-01 ENCOUNTER — Other Ambulatory Visit: Payer: Self-pay

## 2024-05-06 ENCOUNTER — Other Ambulatory Visit: Payer: Self-pay

## 2024-05-06 ENCOUNTER — Other Ambulatory Visit (HOSPITAL_COMMUNITY): Payer: Self-pay

## 2024-05-27 ENCOUNTER — Other Ambulatory Visit (HOSPITAL_COMMUNITY): Payer: Self-pay

## 2024-05-27 ENCOUNTER — Other Ambulatory Visit (HOSPITAL_COMMUNITY): Payer: Self-pay | Admitting: Family Medicine

## 2024-05-27 MED ORDER — FENOFIBRATE 145 MG PO TABS
145.0000 mg | ORAL_TABLET | Freq: Every day | ORAL | 3 refills | Status: AC
  Filled 2024-05-27: qty 90, 90d supply, fill #0
